# Patient Record
Sex: Female | Born: 1995
Health system: Southern US, Community
[De-identification: ages and names within clinical notes are randomized; demographics above are authoritative.]

## PROBLEM LIST (undated history)

## (undated) DIAGNOSIS — R112 Nausea with vomiting, unspecified: Secondary | ICD-10-CM

## (undated) DIAGNOSIS — Z9889 Other specified postprocedural states: Secondary | ICD-10-CM

## (undated) DIAGNOSIS — D649 Anemia, unspecified: Secondary | ICD-10-CM

## (undated) HISTORY — PX: WISDOM TOOTH EXTRACTION: SHX21

## (undated) HISTORY — PX: BREAST BIOPSY: SHX20

---

## 2013-10-08 ENCOUNTER — Emergency Department: Payer: Self-pay | Admitting: Emergency Medicine

## 2013-10-08 LAB — COMPREHENSIVE METABOLIC PANEL
ALK PHOS: 35 U/L — AB
Albumin: 3.7 g/dL (ref 3.4–5.0)
Anion Gap: 6 — ABNORMAL LOW (ref 7–16)
BUN: 11 mg/dL (ref 7–18)
Bilirubin,Total: 0.4 mg/dL (ref 0.2–1.0)
CALCIUM: 8.9 mg/dL (ref 8.5–10.1)
CREATININE: 0.92 mg/dL (ref 0.60–1.30)
Chloride: 106 mmol/L (ref 98–107)
Co2: 28 mmol/L (ref 21–32)
EGFR (African American): >60
EGFR (Non-African Amer.): >60
GLUCOSE: 90 mg/dL (ref 65–99)
OSMOLALITY: 278 (ref 275–301)
POTASSIUM: 3.7 mmol/L (ref 3.5–5.1)
SGOT(AST): 30 U/L (ref 15–37)
SGPT (ALT): 26 U/L (ref 12–78)
SODIUM: 140 mmol/L (ref 136–145)
TOTAL PROTEIN: 7.2 g/dL (ref 6.4–8.2)

## 2013-10-08 LAB — URINALYSIS, COMPLETE
BILIRUBIN, UR: NEGATIVE
BLOOD: NEGATIVE
GLUCOSE, UR: NEGATIVE mg/dL (ref 0–75)
Leukocyte Esterase: NEGATIVE
Nitrite: NEGATIVE
PH: 5 (ref 4.5–8.0)
Protein: NEGATIVE
RBC,UR: 2 /HPF (ref 0–5)
Specific Gravity: 1.026 (ref 1.003–1.030)
WBC UR: 6 /HPF (ref 0–5)

## 2013-10-08 LAB — CBC WITH DIFFERENTIAL/PLATELET
Basophil #: 0 10*3/uL (ref 0.0–0.1)
Basophil %: 0.4 %
EOS ABS: 0.1 10*3/uL (ref 0.0–0.7)
EOS PCT: 1.4 %
HCT: 36.9 % (ref 35.0–47.0)
HGB: 11.6 g/dL — AB (ref 12.0–16.0)
LYMPHS PCT: 42.2 %
Lymphocyte #: 2.9 10*3/uL (ref 1.0–3.6)
MCH: 25.2 pg — AB (ref 26.0–34.0)
MCHC: 31.4 g/dL — ABNORMAL LOW (ref 32.0–36.0)
MCV: 80 fL (ref 80–100)
MONO ABS: 0.7 x10 3/mm (ref 0.2–0.9)
MONOS PCT: 10.9 %
NEUTROS ABS: 3.1 10*3/uL (ref 1.4–6.5)
NEUTROS PCT: 45.1 %
Platelet: 277 10*3/uL (ref 150–440)
RBC: 4.59 10*6/uL (ref 3.80–5.20)
RDW: 18.7 % — AB (ref 11.5–14.5)
WBC: 6.9 10*3/uL (ref 3.6–11.0)

## 2013-10-08 LAB — LIPASE, BLOOD: Lipase: 240 U/L (ref 73–393)

## 2013-10-09 LAB — URINE CULTURE

## 2015-03-24 DIAGNOSIS — D239 Other benign neoplasm of skin, unspecified: Secondary | ICD-10-CM

## 2015-03-24 HISTORY — DX: Other benign neoplasm of skin, unspecified: D23.9

## 2016-06-05 ENCOUNTER — Encounter: Payer: Self-pay | Admitting: Podiatry

## 2016-06-05 ENCOUNTER — Ambulatory Visit (INDEPENDENT_AMBULATORY_CARE_PROVIDER_SITE_OTHER): Payer: BLUE CROSS/BLUE SHIELD

## 2016-06-05 ENCOUNTER — Ambulatory Visit (INDEPENDENT_AMBULATORY_CARE_PROVIDER_SITE_OTHER): Payer: BLUE CROSS/BLUE SHIELD | Admitting: Podiatry

## 2016-06-05 VITALS — BP 88/61 | HR 66 | Resp 16

## 2016-06-05 DIAGNOSIS — M2042 Other hammer toe(s) (acquired), left foot: Secondary | ICD-10-CM | POA: Diagnosis not present

## 2016-06-05 DIAGNOSIS — M2041 Other hammer toe(s) (acquired), right foot: Secondary | ICD-10-CM

## 2016-06-05 DIAGNOSIS — M722 Plantar fascial fibromatosis: Secondary | ICD-10-CM

## 2016-06-05 NOTE — Progress Notes (Signed)
   Subjective:    Patient ID: Kim Shannon, female    DOB: 09/16/1995, 21 y.o.   MRN: [id number]  HPI: She presents today after having not seen her for many years with a chief complaint of pain and discoloration to the second digits left foot greater than that of the right. States that she walks several miles a day and notices discoloration and pain with temperature changes. She does elect to have a orthotics.  Review of Systems  All other systems reviewed and are negative.      Objective:   Physical Exam: Vital signs are stable alert and oriented 3. Pulses are palpable. Neurologic sensorium is intact. Tendon reflexes are intact muscle strength is normal bilateral. Orthopedic evaluation of his strength all joints distal to the angle full range of motion without crepitation. She has flexible hammertoe deformity second bilateral with a slightly elongated second toe she also has a distal clavus of these toes which is associated with a hammertoe deformity. Once we maximally extend the proximal interphalangeal joint is noticeable that it barely gets to 180. At this point I feel that she started to accommodate some flexure contracture at the level of the PIPJ. She has had a history of fasciitis and apophysitis before.        Assessment & Plan:  History of plantar fasciitis and apophysitis now hammertoe deformity with distal clavi and Raynaud's disease  Plan: Educated her on vasospastic disorder Raynaud's disease and that she potentially has a hammertoe deformity that many surgical intervention at some point in the future. She understands this is amenable to it she was scanned for a new set of orthotics today.Marland Kitchen

## 2016-06-14 DIAGNOSIS — R5383 Other fatigue: Secondary | ICD-10-CM | POA: Diagnosis not present

## 2016-06-14 DIAGNOSIS — R635 Abnormal weight gain: Secondary | ICD-10-CM | POA: Diagnosis not present

## 2016-07-05 ENCOUNTER — Ambulatory Visit: Payer: BLUE CROSS/BLUE SHIELD | Admitting: Podiatry

## 2016-07-26 ENCOUNTER — Encounter: Payer: Self-pay | Admitting: *Deleted

## 2016-08-02 ENCOUNTER — Encounter: Payer: Self-pay | Admitting: Podiatry

## 2016-08-02 ENCOUNTER — Ambulatory Visit (INDEPENDENT_AMBULATORY_CARE_PROVIDER_SITE_OTHER): Payer: BLUE CROSS/BLUE SHIELD | Admitting: Podiatry

## 2016-08-02 DIAGNOSIS — M722 Plantar fascial fibromatosis: Secondary | ICD-10-CM

## 2016-08-02 NOTE — Progress Notes (Signed)
She presents today for follow-up of orthotics as they're doing his greater had no problems whatsoever.  Objective: No pain on palpation of the bilateral heels are mid arches.  Assessment: Will resolve plantar fasciitis.  Plan: Follow up with me as needed.

## 2016-11-15 DIAGNOSIS — L811 Chloasma: Secondary | ICD-10-CM | POA: Diagnosis not present

## 2016-11-15 DIAGNOSIS — L812 Freckles: Secondary | ICD-10-CM | POA: Diagnosis not present

## 2016-11-15 DIAGNOSIS — D485 Neoplasm of uncertain behavior of skin: Secondary | ICD-10-CM | POA: Diagnosis not present

## 2016-11-15 DIAGNOSIS — Z1283 Encounter for screening for malignant neoplasm of skin: Secondary | ICD-10-CM | POA: Diagnosis not present

## 2016-11-30 DIAGNOSIS — Z0001 Encounter for general adult medical examination with abnormal findings: Secondary | ICD-10-CM | POA: Diagnosis not present

## 2016-11-30 DIAGNOSIS — Z6825 Body mass index (BMI) 25.0-25.9, adult: Secondary | ICD-10-CM | POA: Diagnosis not present

## 2016-11-30 DIAGNOSIS — Z30018 Encounter for initial prescription of other contraceptives: Secondary | ICD-10-CM | POA: Diagnosis not present

## 2017-01-31 DIAGNOSIS — Z23 Encounter for immunization: Secondary | ICD-10-CM | POA: Diagnosis not present

## 2017-04-08 DIAGNOSIS — Z30011 Encounter for initial prescription of contraceptive pills: Secondary | ICD-10-CM | POA: Diagnosis not present

## 2017-04-19 ENCOUNTER — Other Ambulatory Visit: Payer: Self-pay | Admitting: Nurse Practitioner

## 2017-04-19 DIAGNOSIS — Z3041 Encounter for surveillance of contraceptive pills: Secondary | ICD-10-CM

## 2017-04-19 MED ORDER — TRI-PREVIFEM 0.18/0.215/0.25 MG-35 MCG PO TABS: 1.0000 | ORAL_TABLET | Freq: Every day | ORAL | 11 refills | Status: DC

## 2017-04-19 NOTE — Progress Notes (Signed)
Renewed patient's birth control pills for next year.

## 2017-04-20 ENCOUNTER — Other Ambulatory Visit: Payer: Self-pay

## 2017-07-16 DIAGNOSIS — Z1283 Encounter for screening for malignant neoplasm of skin: Secondary | ICD-10-CM | POA: Diagnosis not present

## 2017-07-16 DIAGNOSIS — L813 Cafe au lait spots: Secondary | ICD-10-CM | POA: Diagnosis not present

## 2017-07-16 DIAGNOSIS — D485 Neoplasm of uncertain behavior of skin: Secondary | ICD-10-CM | POA: Diagnosis not present

## 2017-07-16 DIAGNOSIS — D229 Melanocytic nevi, unspecified: Secondary | ICD-10-CM | POA: Diagnosis not present

## 2017-10-16 ENCOUNTER — Ambulatory Visit: Payer: BLUE CROSS/BLUE SHIELD | Admitting: Nurse Practitioner

## 2017-10-16 ENCOUNTER — Encounter (INDEPENDENT_AMBULATORY_CARE_PROVIDER_SITE_OTHER): Payer: Self-pay

## 2017-10-16 ENCOUNTER — Encounter: Payer: Self-pay | Admitting: Nurse Practitioner

## 2017-10-16 VITALS — BP 108/72 | HR 73 | Resp 16 | Ht 66.0 in | Wt 163.4 lb

## 2017-10-16 DIAGNOSIS — Z30011 Encounter for initial prescription of contraceptive pills: Secondary | ICD-10-CM

## 2017-10-16 DIAGNOSIS — N926 Irregular menstruation, unspecified: Secondary | ICD-10-CM | POA: Insufficient documentation

## 2017-10-16 DIAGNOSIS — Z0001 Encounter for general adult medical examination with abnormal findings: Secondary | ICD-10-CM | POA: Insufficient documentation

## 2017-10-16 MED ORDER — DROSPIRENONE-ETHINYL ESTRADIOL 3-0.02 MG PO TABS
1.0000 | ORAL_TABLET | Freq: Every day | ORAL | 11 refills | Status: DC
Start: 2017-10-16 — End: 2018-09-18

## 2017-10-16 NOTE — Progress Notes (Signed)
[facility] [place], [place] [address]  Internal MEDICINE  Office Visit Note  Patient Name: Kim Shannon  [id number]  [id number]  Date of Service: 10/16/2017   Pt is here for routine follow up.   Chief Complaint  Patient presents with  . Medication Management    pt here to talk about birth control tri-previfem    The patient states that she has been having spotting in between periods for the past 4 months or longer. She also states that she had her first migraine during her menstrual cycle this past month. She would like to see if there is a different oral contraceptive she could try.       Current Medication: Outpatient Encounter Medications as of 10/16/2017  Medication Sig  . TRI-PREVIFEM 0.18/0.215/0.25 MG-35 MCG tablet Take 1 tablet by mouth daily.  . drospirenone-ethinyl estradiol (YAZ,GIANVI,LORYNA) 3-0.02 MG tablet Take 1 tablet by mouth daily.   No facility-administered encounter medications on file as of 10/16/2017.     Surgical History: History reviewed. No pertinent surgical history.  Medical History: History reviewed. No pertinent past medical history.  Family History: Family History  Problem Relation Age of Onset  . Breast cancer Paternal Grandmother   . Prostate cancer Paternal Grandfather   . Diabetes Paternal Grandfather     Social History   Socioeconomic History  . Marital status: Single    Spouse name: Not on file  . Number of children: Not on file  . Years of education: Not on file  . Highest education level: Not on file  Occupational History  . Not on file  Social Needs  . Financial resource strain: Not on file  . Food insecurity:    Worry: Not on file    Inability: Not on file  . Transportation needs:    Medical: Not on file    Non-medical: Not on file  Tobacco Use  . Smoking status: Never Smoker  . Smokeless tobacco: Never Used  Substance and Sexual Activity  . Alcohol use: No  . Drug use: Not on file   . Sexual activity: Not on file  Lifestyle  . Physical activity:    Days per week: Not on file    Minutes per session: Not on file  . Stress: Not on file  Relationships  . Social connections:    Talks on phone: Not on file    Gets together: Not on file    Attends religious service: Not on file    Active member of club or organization: Not on file    Attends meetings of clubs or organizations: Not on file    Relationship status: Not on file  . Intimate partner violence:    Fear of current or ex partner: Not on file    Emotionally abused: Not on file    Physically abused: Not on file    Forced sexual activity: Not on file  Other Topics Concern  . Not on file  Social History Narrative  . Not on file      Review of Systems  Constitutional: Negative for chills, fatigue and unexpected weight change.  HENT: Negative for congestion, postnasal drip, rhinorrhea, sneezing and sore throat.   Eyes: Negative.  Negative for redness.  Respiratory: Negative for cough, chest tightness and shortness of breath.   Cardiovascular: Negative for chest pain and palpitations.  Gastrointestinal: Negative for abdominal pain, constipation, diarrhea, nausea and vomiting.  Genitourinary: Negative for dysuria and frequency.  Spotting in between periods.   Musculoskeletal: Negative for arthralgias, back pain, joint swelling and neck pain.  Skin: Negative for rash.  Neurological: Positive for headaches. Negative for tremors and numbness.  Hematological: Negative for adenopathy. Does not bruise/bleed easily.  Psychiatric/Behavioral: Negative for behavioral problems (Depression), sleep disturbance and suicidal ideas. The patient is not nervous/anxious.     Vital Signs: BP 108/72 (BP Location: Right Arm, Patient Position: Sitting, Cuff Size: Normal)   Pulse 73   Resp 16   Ht 5\' 6"  (1.676 m)   Wt 163 lb 6.4 oz (74.1 kg)   SpO2 99%   BMI 26.37 kg/m    Physical Exam  Constitutional: She is  oriented to person, place, and time. She appears well-developed and well-nourished. No distress.  HENT:  Head: Normocephalic and atraumatic.  Mouth/Throat: Oropharynx is clear and moist. No oropharyngeal exudate.  Eyes: Pupils are equal, round, and reactive to light. EOM are normal.  Neck: Normal range of motion. Neck supple. No JVD present. No tracheal deviation present. No thyromegaly present.  Cardiovascular: Normal rate, regular rhythm and normal heart sounds. Exam reveals no gallop and no friction rub.  No murmur heard. Pulmonary/Chest: Effort normal. No respiratory distress. She has no wheezes. She has no rales. She exhibits no tenderness.  Abdominal: Soft. Bowel sounds are normal.  Musculoskeletal: Normal range of motion.  Lymphadenopathy:    She has no cervical adenopathy.  Neurological: She is alert and oriented to person, place, and time. No cranial nerve deficit.  Skin: Skin is warm and dry. She is not diaphoretic.  Psychiatric: She has a normal mood and affect. Her behavior is normal. Judgment and thought content normal.  Nursing note and vitals reviewed.  Assessment/Plan: 1. Irregular menstrual bleeding Change oral contraceptive medication to YAZ. Take as directed. Reassess at next visit.   2. Encounter for prescription of oral contraceptives - drospirenone-ethinyl estradiol (YAZ,GIANVI,LORYNA) 3-0.02 MG tablet; Take 1 tablet by mouth daily.  Dispense: 1 Package; Refill: 11  General Counseling: Kim Shannon verbalizes understanding of the findings of todays visit and agrees with plan of treatment. I have discussed any further diagnostic evaluation that may be needed or ordered today. We also reviewed her medications today. she has been encouraged to call the office with any questions or concerns that should arise related to todays visit.    Counseling:   This patient was seen by Leretha Pol, FNP- C in Collaboration with Dr Lavera Guise as a part of collaborative care  agreement  Meds ordered this encounter  Medications  . drospirenone-ethinyl estradiol (YAZ,GIANVI,LORYNA) 3-0.02 MG tablet    Sig: Take 1 tablet by mouth daily.    Dispense:  1 Package    Refill:  11    Order Specific Question:   Supervising Provider    Answer:   Lavera Guise [[id number]]    Time spent: 32 Minutes     Dr Lavera Guise Internal medicine

## 2018-01-21 ENCOUNTER — Encounter: Payer: Self-pay | Admitting: Nurse Practitioner

## 2018-01-21 ENCOUNTER — Ambulatory Visit (INDEPENDENT_AMBULATORY_CARE_PROVIDER_SITE_OTHER): Payer: BLUE CROSS/BLUE SHIELD | Admitting: Nurse Practitioner

## 2018-01-21 VITALS — BP 111/73 | HR 65 | Resp 16 | Ht 66.0 in | Wt 160.0 lb

## 2018-01-21 DIAGNOSIS — Z23 Encounter for immunization: Secondary | ICD-10-CM

## 2018-01-21 DIAGNOSIS — R300 Dysuria: Secondary | ICD-10-CM

## 2018-01-21 DIAGNOSIS — Z0001 Encounter for general adult medical examination with abnormal findings: Secondary | ICD-10-CM

## 2018-01-21 DIAGNOSIS — Z124 Encounter for screening for malignant neoplasm of cervix: Secondary | ICD-10-CM | POA: Diagnosis not present

## 2018-01-21 DIAGNOSIS — E559 Vitamin D deficiency, unspecified: Secondary | ICD-10-CM

## 2018-01-21 NOTE — Progress Notes (Signed)
[facility] [place], [place] [address]  Internal MEDICINE  Office Visit Note  Patient Name: Kim Shannon  [id number]  [id number]  Date of Service: 01/21/2018   Pt is here for routine health maintenance examination  Chief Complaint  Patient presents with  . Gynecologic Exam  . Annual Exam     The patient is here for routine health maintenance exam. She reports doing well. She is currently taking Yax for birth control. Doing well. Did have longer than usual period this past month, but states that she was under more stress than normal. She is due to have a pap smear and routine labs.     Current Medication: Outpatient Encounter Medications as of 01/21/2018  Medication Sig  . drospirenone-ethinyl estradiol (YAZ,GIANVI,LORYNA) 3-0.02 MG tablet Take 1 tablet by mouth daily.  . TRI-PREVIFEM 0.18/0.215/0.25 MG-35 MCG tablet Take 1 tablet by mouth daily.   No facility-administered encounter medications on file as of 01/21/2018.     Surgical History: History reviewed. No pertinent surgical history.  Medical History: History reviewed. No pertinent past medical history.  Family History: Family History  Problem Relation Age of Onset  . Breast cancer Paternal Grandmother   . Prostate cancer Paternal Grandfather   . Diabetes Paternal Grandfather       Review of Systems  Constitutional: Negative for activity change, chills, fatigue and unexpected weight change.  HENT: Negative for congestion, postnasal drip, rhinorrhea, sneezing and sore throat.   Eyes: Negative.  Negative for redness.  Respiratory: Negative for cough, chest tightness, shortness of breath and wheezing.   Cardiovascular: Negative for chest pain and palpitations.  Gastrointestinal: Negative for abdominal pain, constipation, diarrhea, nausea and vomiting.  Endocrine: Negative for cold intolerance, heat intolerance, polydipsia, polyphagia and polyuria.  Genitourinary: Negative for  dyspareunia, dysuria, frequency, vaginal bleeding, vaginal discharge and vaginal pain.  Musculoskeletal: Negative for arthralgias, back pain, joint swelling and neck pain.  Skin: Negative for rash.  Allergic/Immunologic: Negative for environmental allergies.  Neurological: Negative for dizziness, tremors, numbness and headaches.  Hematological: Negative for adenopathy. Does not bruise/bleed easily.  Psychiatric/Behavioral: Negative for behavioral problems (Depression), sleep disturbance and suicidal ideas. The patient is not nervous/anxious.      Vital Signs: BP 111/73 (BP Location: Right Arm, Patient Position: Sitting, Cuff Size: Normal)   Pulse 65   Resp 16   Ht 5\' 6"  (1.676 m)   Wt 160 lb (72.6 kg)   LMP 01/07/2018   SpO2 100%   BMI 25.82 kg/m    Physical Exam  Constitutional: She is oriented to person, place, and time. She appears well-developed and well-nourished. No distress.  HENT:  Head: Normocephalic and atraumatic.  Nose: Nose normal.  Mouth/Throat: Oropharynx is clear and moist. No oropharyngeal exudate.  Eyes: Pupils are equal, round, and reactive to light. Conjunctivae and EOM are normal.  Neck: Normal range of motion. Neck supple. No JVD present. No tracheal deviation present. No thyromegaly present.  Cardiovascular: Normal rate, regular rhythm, normal heart sounds and intact distal pulses. Exam reveals no gallop and no friction rub.  No murmur heard. Pulmonary/Chest: Effort normal and breath sounds normal. No respiratory distress. She has no wheezes. She has no rales. She exhibits no tenderness. Right breast exhibits no inverted nipple, no mass, no nipple discharge, no skin change and no tenderness. Left breast exhibits no inverted nipple, no mass, no nipple discharge, no skin change and no tenderness.  Abdominal: Soft. Bowel sounds are normal. There is no tenderness.  Genitourinary: Vagina normal and uterus normal.  Genitourinary Comments: No tenderness, masses, or  organomeglay present during bimanual exam .  Musculoskeletal: Normal range of motion.  Lymphadenopathy:    She has no cervical adenopathy.  Neurological: She is alert and oriented to person, place, and time. No cranial nerve deficit.  Skin: Skin is warm and dry. Capillary refill takes less than 2 seconds. She is not diaphoretic.  Psychiatric: She has a normal mood and affect. Her behavior is normal. Judgment and thought content normal.  Nursing note and vitals reviewed.  Assessment/Plan: 1. Encounter for general adult medical examination with abnormal findings Annual health maintenance exam today.  - CBC with Differential/Platelet - Comprehensive metabolic panel - T4, free - TSH - Lipid panel  2. Vitamin D deficiency - Vitamin D 1,25 dihydroxy  3. Screening for malignant neoplasm of cervix - Pap IG and HPV (high risk) DNA detection  4. Needs flu shot - Flu Vaccine MDCK QUAD PF  5. Dysuria - UA/M w/rflx Culture, Routine  General Counseling: Annelisa verbalizes understanding of the findings of todays visit and agrees with plan of treatment. I have discussed any further diagnostic evaluation that may be needed or ordered today. We also reviewed her medications today. she has been encouraged to call the office with any questions or concerns that should arise related to todays visit.    Counseling:  This patient was seen by Leretha Pol FNP Collaboration with Dr Lavera Guise as a part of collaborative care agreement  Orders Placed This Encounter  Procedures  . Flu Vaccine MDCK QUAD PF  . UA/M w/rflx Culture, Routine  . CBC with Differential/Platelet  . Comprehensive metabolic panel  . T4, free  . TSH  . Lipid panel  . Vitamin D 1,25 dihydroxy     Time spent: [place], MD  Internal Medicine

## 2018-01-22 LAB — UA/M W/RFLX CULTURE, ROUTINE
Bilirubin, UA: NEGATIVE
Glucose, UA: NEGATIVE
KETONES UA: NEGATIVE
LEUKOCYTES UA: NEGATIVE
Nitrite, UA: NEGATIVE
PROTEIN UA: NEGATIVE
RBC UA: NEGATIVE
SPEC GRAV UA: 1.007 (ref 1.005–1.030)
Urobilinogen, Ur: 0.2 mg/dL (ref 0.2–1.0)
pH, UA: 8.5 — ABNORMAL HIGH (ref 5.0–7.5)

## 2018-01-22 LAB — MICROSCOPIC EXAMINATION: Casts: NONE SEEN /lpf

## 2018-01-24 ENCOUNTER — Telehealth: Payer: Self-pay

## 2018-01-24 LAB — PAP IG AND HPV HIGH-RISK
HPV, high-risk: NEGATIVE
PAP Smear Comment: 0

## 2018-01-24 NOTE — Telephone Encounter (Signed)
-----   Message from Ronnell Freshwater, NP sent at 01/24/2018  3:12 PM EDT ----- Please let the patient know that her pap smear was negative

## 2018-01-24 NOTE — Telephone Encounter (Signed)
Informed pt of her test results

## 2018-01-25 ENCOUNTER — Other Ambulatory Visit: Payer: Self-pay | Admitting: Nurse Practitioner

## 2018-01-25 DIAGNOSIS — E559 Vitamin D deficiency, unspecified: Secondary | ICD-10-CM | POA: Diagnosis not present

## 2018-01-25 DIAGNOSIS — Z0001 Encounter for general adult medical examination with abnormal findings: Secondary | ICD-10-CM | POA: Diagnosis not present

## 2018-01-26 LAB — COMPREHENSIVE METABOLIC PANEL
ALT: 15 IU/L (ref 0–32)
AST: 25 IU/L (ref 0–40)
Albumin/Globulin Ratio: 1.9 (ref 1.2–2.2)
Albumin: 4.6 g/dL (ref 3.5–5.5)
Alkaline Phosphatase: 46 IU/L (ref 39–117)
BUN/Creatinine Ratio: 11 (ref 9–23)
BUN: 9 mg/dL (ref 6–20)
Bilirubin Total: 0.3 mg/dL (ref 0.0–1.2)
CALCIUM: 9.5 mg/dL (ref 8.7–10.2)
CO2: 21 mmol/L (ref 20–29)
CREATININE: 0.83 mg/dL (ref 0.57–1.00)
Chloride: 101 mmol/L (ref 96–106)
GFR calc Af Amer: 114 mL/min/{1.73_m2} (ref >59)
GFR, EST NON AFRICAN AMERICAN: 99 mL/min/{1.73_m2} (ref >59)
GLOBULIN, TOTAL: 2.4 g/dL (ref 1.5–4.5)
GLUCOSE: 83 mg/dL (ref 65–99)
Potassium: 4.1 mmol/L (ref 3.5–5.2)
SODIUM: 139 mmol/L (ref 134–144)
Total Protein: 7 g/dL (ref 6.0–8.5)

## 2018-01-26 LAB — CBC
HEMATOCRIT: 36.7 % (ref 34.0–46.6)
HEMOGLOBIN: 12 g/dL (ref 11.1–15.9)
MCH: 25.4 pg — AB (ref 26.6–33.0)
MCHC: 32.7 g/dL (ref 31.5–35.7)
MCV: 78 fL — AB (ref 79–97)
PLATELETS: 177 10*3/uL (ref 150–450)
RBC: 4.72 x10E6/uL (ref 3.77–5.28)
RDW: 14.8 % (ref 12.3–15.4)
WBC: 8.2 10*3/uL (ref 3.4–10.8)

## 2018-01-26 LAB — LIPID PANEL W/O CHOL/HDL RATIO
Cholesterol, Total: 202 mg/dL — ABNORMAL HIGH (ref 100–199)
HDL: 73 mg/dL (ref >39)
LDL CALC: 113 mg/dL — AB (ref 0–99)
Triglycerides: 79 mg/dL (ref 0–149)
VLDL CHOLESTEROL CAL: 16 mg/dL (ref 5–40)

## 2018-01-26 LAB — T4, FREE: Free T4: 1.46 ng/dL (ref 0.82–1.77)

## 2018-01-26 LAB — T3: T3, Total: 172 ng/dL (ref 71–180)

## 2018-01-26 LAB — VITAMIN D 25 HYDROXY (VIT D DEFICIENCY, FRACTURES): VIT D 25 HYDROXY: 40.2 ng/mL (ref 30.0–100.0)

## 2018-01-26 LAB — TSH: TSH: 2.51 u[IU]/mL (ref 0.450–4.500)

## 2018-01-28 ENCOUNTER — Telehealth: Payer: Self-pay

## 2018-01-28 NOTE — Telephone Encounter (Signed)
-----   Message from Ronnell Freshwater, NP sent at 01/26/2018 10:11 PM EDT ----- Please let the patient know that her labs are back. Her cholesterol is a tiny bit high, but all other labs were good. Thanks. Also, we can mail her out prudent diet information.

## 2018-01-28 NOTE — Telephone Encounter (Signed)
Left message on pt voicemail informing her of her results also stated we could mail  prudent diet information to her and informed her that if she had any questions or concerns to give [place] a call at the office.

## 2018-02-01 ENCOUNTER — Telehealth: Payer: Self-pay

## 2018-02-01 NOTE — Telephone Encounter (Signed)
Mailed the prudent diet to patient

## 2018-02-01 NOTE — Telephone Encounter (Signed)
-----   Message from Ronnell Freshwater, NP sent at 01/26/2018 10:11 PM EDT ----- Please let the patient know that her labs are back. Her cholesterol is a tiny bit high, but all other labs were good. Thanks. Also, we can mail her out prudent diet information.

## 2018-08-22 DIAGNOSIS — D225 Melanocytic nevi of trunk: Secondary | ICD-10-CM | POA: Diagnosis not present

## 2018-08-22 DIAGNOSIS — L578 Other skin changes due to chronic exposure to nonionizing radiation: Secondary | ICD-10-CM | POA: Diagnosis not present

## 2018-08-22 DIAGNOSIS — Z1283 Encounter for screening for malignant neoplasm of skin: Secondary | ICD-10-CM | POA: Diagnosis not present

## 2018-08-22 DIAGNOSIS — D485 Neoplasm of uncertain behavior of skin: Secondary | ICD-10-CM | POA: Diagnosis not present

## 2018-08-27 DIAGNOSIS — M62830 Muscle spasm of back: Secondary | ICD-10-CM | POA: Diagnosis not present

## 2018-08-27 DIAGNOSIS — M9904 Segmental and somatic dysfunction of sacral region: Secondary | ICD-10-CM | POA: Diagnosis not present

## 2018-08-27 DIAGNOSIS — M5127 Other intervertebral disc displacement, lumbosacral region: Secondary | ICD-10-CM | POA: Diagnosis not present

## 2018-08-27 DIAGNOSIS — M9903 Segmental and somatic dysfunction of lumbar region: Secondary | ICD-10-CM | POA: Diagnosis not present

## 2018-08-29 DIAGNOSIS — M62830 Muscle spasm of back: Secondary | ICD-10-CM | POA: Diagnosis not present

## 2018-08-29 DIAGNOSIS — M9903 Segmental and somatic dysfunction of lumbar region: Secondary | ICD-10-CM | POA: Diagnosis not present

## 2018-08-29 DIAGNOSIS — M5127 Other intervertebral disc displacement, lumbosacral region: Secondary | ICD-10-CM | POA: Diagnosis not present

## 2018-08-29 DIAGNOSIS — M9904 Segmental and somatic dysfunction of sacral region: Secondary | ICD-10-CM | POA: Diagnosis not present

## 2018-09-03 DIAGNOSIS — M62830 Muscle spasm of back: Secondary | ICD-10-CM | POA: Diagnosis not present

## 2018-09-03 DIAGNOSIS — M9903 Segmental and somatic dysfunction of lumbar region: Secondary | ICD-10-CM | POA: Diagnosis not present

## 2018-09-03 DIAGNOSIS — M9904 Segmental and somatic dysfunction of sacral region: Secondary | ICD-10-CM | POA: Diagnosis not present

## 2018-09-03 DIAGNOSIS — M5127 Other intervertebral disc displacement, lumbosacral region: Secondary | ICD-10-CM | POA: Diagnosis not present

## 2018-09-05 DIAGNOSIS — M5127 Other intervertebral disc displacement, lumbosacral region: Secondary | ICD-10-CM | POA: Diagnosis not present

## 2018-09-05 DIAGNOSIS — M62830 Muscle spasm of back: Secondary | ICD-10-CM | POA: Diagnosis not present

## 2018-09-05 DIAGNOSIS — M9903 Segmental and somatic dysfunction of lumbar region: Secondary | ICD-10-CM | POA: Diagnosis not present

## 2018-09-05 DIAGNOSIS — M9904 Segmental and somatic dysfunction of sacral region: Secondary | ICD-10-CM | POA: Diagnosis not present

## 2018-09-09 DIAGNOSIS — M62830 Muscle spasm of back: Secondary | ICD-10-CM | POA: Diagnosis not present

## 2018-09-09 DIAGNOSIS — M9903 Segmental and somatic dysfunction of lumbar region: Secondary | ICD-10-CM | POA: Diagnosis not present

## 2018-09-09 DIAGNOSIS — M5127 Other intervertebral disc displacement, lumbosacral region: Secondary | ICD-10-CM | POA: Diagnosis not present

## 2018-09-09 DIAGNOSIS — M9904 Segmental and somatic dysfunction of sacral region: Secondary | ICD-10-CM | POA: Diagnosis not present

## 2018-09-12 DIAGNOSIS — M5127 Other intervertebral disc displacement, lumbosacral region: Secondary | ICD-10-CM | POA: Diagnosis not present

## 2018-09-12 DIAGNOSIS — M62830 Muscle spasm of back: Secondary | ICD-10-CM | POA: Diagnosis not present

## 2018-09-12 DIAGNOSIS — M9904 Segmental and somatic dysfunction of sacral region: Secondary | ICD-10-CM | POA: Diagnosis not present

## 2018-09-12 DIAGNOSIS — M9903 Segmental and somatic dysfunction of lumbar region: Secondary | ICD-10-CM | POA: Diagnosis not present

## 2018-09-18 ENCOUNTER — Other Ambulatory Visit: Payer: Self-pay

## 2018-09-18 DIAGNOSIS — Z30011 Encounter for initial prescription of contraceptive pills: Secondary | ICD-10-CM

## 2018-09-18 MED ORDER — DROSPIRENONE-ETHINYL ESTRADIOL 3-0.02 MG PO TABS: 1.0000 | ORAL_TABLET | Freq: Every day | ORAL | 1 refills | Status: DC

## 2018-10-23 ENCOUNTER — Telehealth: Payer: Self-pay

## 2018-10-23 NOTE — Telephone Encounter (Signed)
PER PATIENTS INSURANCE THERE IS A GAP IN CARE. THIS IS NOT A PATIENT OF [facility].

## 2019-01-24 ENCOUNTER — Encounter: Payer: Self-pay | Admitting: Nurse Practitioner

## 2019-01-24 ENCOUNTER — Ambulatory Visit (INDEPENDENT_AMBULATORY_CARE_PROVIDER_SITE_OTHER): Payer: BLUE CROSS/BLUE SHIELD | Admitting: Nurse Practitioner

## 2019-01-24 ENCOUNTER — Other Ambulatory Visit: Payer: Self-pay

## 2019-01-24 VITALS — BP 117/78 | HR 66 | Temp 96.7°F | Resp 16 | Ht 66.0 in | Wt 163.0 lb

## 2019-01-24 DIAGNOSIS — R300 Dysuria: Secondary | ICD-10-CM

## 2019-01-24 DIAGNOSIS — Z30011 Encounter for initial prescription of contraceptive pills: Secondary | ICD-10-CM

## 2019-01-24 DIAGNOSIS — Z0001 Encounter for general adult medical examination with abnormal findings: Secondary | ICD-10-CM

## 2019-01-24 DIAGNOSIS — N6322 Unspecified lump in the left breast, upper inner quadrant: Secondary | ICD-10-CM | POA: Diagnosis not present

## 2019-01-24 MED ORDER — DROSPIRENONE-ETHINYL ESTRADIOL 3-0.02 MG PO TABS: 1.0000 | ORAL_TABLET | Freq: Every day | ORAL | 3 refills | Status: DC

## 2019-01-24 NOTE — Progress Notes (Signed)
[facility] [place], [place] [address]  Internal MEDICINE  Office Visit Note  Patient Name: Kim Shannon  [id number]  [id number]  Date of Service: 01/24/2019  Chief Complaint  Patient presents with  . Annual Exam     The patient is here for health maintenance exam. She is doing well without concerns or complaints. Last pap was done 01/2018 and was normal. She is due to have routine, fasting labs. She does state that she had some breast tenderness in the lower mid portion of the left breast. This started right after she had menstrual cycle. Lasted for two weeks then resolved on it's own.   Pt is here for routine health maintenance examination  Current Medication: Outpatient Encounter Medications as of 01/24/2019  Medication Sig  . drospirenone-ethinyl estradiol (YAZ) 3-0.02 MG tablet Take 1 tablet by mouth daily.  . [DISCONTINUED] drospirenone-ethinyl estradiol (YAZ) 3-0.02 MG tablet Take 1 tablet by mouth daily.  . [DISCONTINUED] TRI-PREVIFEM 0.18/0.215/0.25 MG-35 MCG tablet Take 1 tablet by mouth daily. (Patient not taking: Reported on 09/18/2018)   No facility-administered encounter medications on file as of 01/24/2019.     Surgical History: History reviewed. No pertinent surgical history.  Medical History: History reviewed. No pertinent past medical history.  Family History: Family History  Problem Relation Age of Onset  . Breast cancer Paternal Grandmother   . Prostate cancer Paternal Grandfather   . Diabetes Paternal Grandfather       Review of Systems  Constitutional: Negative for activity change, chills, fatigue and unexpected weight change.  HENT: Negative for congestion, postnasal drip, rhinorrhea, sneezing and sore throat.   Respiratory: Negative for cough, chest tightness, shortness of breath and wheezing.   Cardiovascular: Negative for chest pain and palpitations.  Gastrointestinal: Negative for abdominal pain,  constipation, diarrhea, nausea and vomiting.  Endocrine: Negative for cold intolerance, heat intolerance, polydipsia and polyuria.  Genitourinary:       Has had breast tenderness in lower inner quadrant of left breast. Lasted for about 2 weeks and then resolved on it's own.   Musculoskeletal: Negative for arthralgias, back pain, joint swelling and neck pain.  Skin: Negative for rash.  Neurological: Negative for dizziness, tremors, numbness and headaches.  Hematological: Negative for adenopathy. Does not bruise/bleed easily.  Psychiatric/Behavioral: Negative for behavioral problems (Depression), sleep disturbance and suicidal ideas. The patient is not nervous/anxious.     Today's Vitals   01/24/19 0949  BP: 117/78  Pulse: 66  Resp: 16  Temp: (!) 96.7 F (35.9 C)  SpO2: 100%  Weight: 163 lb (73.9 kg)  Height: 5\' 6"  (1.676 m)   Body mass index is 26.31 kg/m.  Physical Exam Vitals signs and nursing note reviewed.  Constitutional:      General: She is not in acute distress.    Appearance: Normal appearance. She is well-developed. She is not diaphoretic.  HENT:     Head: Normocephalic and atraumatic.     Nose: Nose normal.     Mouth/Throat:     Pharynx: No oropharyngeal exudate.  Eyes:     Pupils: Pupils are equal, round, and reactive to light.  Neck:     Musculoskeletal: Normal range of motion and neck supple. No muscular tenderness.     Thyroid: No thyromegaly.     Vascular: No JVD.     Trachea: No tracheal deviation.  Cardiovascular:     Rate and Rhythm: Normal rate and regular rhythm.     Pulses: Normal pulses.  Heart sounds: Normal heart sounds. No murmur. No friction rub. No gallop.   Pulmonary:     Effort: Pulmonary effort is normal. No respiratory distress.     Breath sounds: No wheezing or rales.  Chest:     Chest wall: No tenderness.     Breasts:        Right: Normal. No swelling, bleeding, inverted nipple, mass, nipple discharge, skin change or tenderness.         Left: Mass present. No swelling, bleeding, inverted nipple, nipple discharge, skin change or tenderness.    Abdominal:     General: Abdomen is flat. Bowel sounds are normal.     Palpations: Abdomen is soft.     Tenderness: There is no abdominal tenderness.  Musculoskeletal: Normal range of motion.  Lymphadenopathy:     Cervical: No cervical adenopathy.     Upper Body:     Right upper body: No supraclavicular or axillary adenopathy.     Left upper body: No supraclavicular or axillary adenopathy.  Skin:    General: Skin is warm and dry.  Neurological:     Mental Status: She is alert and oriented to person, place, and time.     Cranial Nerves: No cranial nerve deficit.  Psychiatric:        Behavior: Behavior normal.        Thought Content: Thought content normal.        Judgment: Judgment normal.   Assessment/Plan: 1. Encounter for general adult medical examination with abnormal findings Annual wellness visit today. Lab slip given to check routine, fasting labs.   2. Mass of upper inner quadrant of left breast Will get ultrasound of area of concern for further evaluation.  - US BREAST LTD UNI LEFT INC AXILLA; Future  3. Encounter for prescription of oral contraceptives - drospirenone-ethinyl estradiol (YAZ) 3-0.02 MG tablet; Take 1 tablet by mouth daily.  Dispense: 90 tablet; Refill: 3  4. Dysuria - UA/M w/rflx Culture, Routine  General Counseling: Kim Shannon verbalizes understanding of the findings of todays visit and agrees with plan of treatment. I have discussed any further diagnostic evaluation that may be needed or ordered today. We also reviewed her medications today. she has been encouraged to call the office with any questions or concerns that should arise related to todays visit.    Counseling:  This patient was seen by Leretha Pol FNP Collaboration with Dr Lavera Guise as a part of collaborative care agreement  Orders Placed This Encounter  Procedures  .  US BREAST LTD UNI LEFT INC AXILLA  . UA/M w/rflx Culture, Routine    Meds ordered this encounter  Medications  . drospirenone-ethinyl estradiol (YAZ) 3-0.02 MG tablet    Sig: Take 1 tablet by mouth daily.    Dispense:  90 tablet    Refill:  3    Order Specific Question:   Supervising Provider    Answer:   Lavera Guise [id number]    Time spent: [place], MD  Internal Medicine

## 2019-01-25 LAB — UA/M W/RFLX CULTURE, ROUTINE
Bilirubin, UA: NEGATIVE
Glucose, UA: NEGATIVE
Ketones, UA: NEGATIVE
Leukocytes,UA: NEGATIVE
Nitrite, UA: NEGATIVE
Protein,UA: NEGATIVE
RBC, UA: NEGATIVE
Specific Gravity, UA: 1.016 (ref 1.005–1.030)
Urobilinogen, Ur: 0.2 mg/dL (ref 0.2–1.0)
pH, UA: 7 (ref 5.0–7.5)

## 2019-01-25 LAB — MICROSCOPIC EXAMINATION: Casts: NONE SEEN /lpf

## 2019-01-30 ENCOUNTER — Other Ambulatory Visit: Payer: Self-pay | Admitting: Nurse Practitioner

## 2019-01-30 DIAGNOSIS — E782 Mixed hyperlipidemia: Secondary | ICD-10-CM | POA: Diagnosis not present

## 2019-01-30 DIAGNOSIS — Z0001 Encounter for general adult medical examination with abnormal findings: Secondary | ICD-10-CM | POA: Diagnosis not present

## 2019-01-30 DIAGNOSIS — E559 Vitamin D deficiency, unspecified: Secondary | ICD-10-CM | POA: Diagnosis not present

## 2019-01-31 ENCOUNTER — Ambulatory Visit
Admission: RE | Admit: 2019-01-31 | Discharge: 2019-01-31 | Disposition: A | Discharge status: Home / Self Care | Payer: BLUE CROSS/BLUE SHIELD | Source: Ambulatory Visit | Attending: Nurse Practitioner | Admitting: Nurse Practitioner

## 2019-01-31 DIAGNOSIS — N6489 Other specified disorders of breast: Secondary | ICD-10-CM | POA: Diagnosis not present

## 2019-01-31 DIAGNOSIS — N6322 Unspecified lump in the left breast, upper inner quadrant: Secondary | ICD-10-CM | POA: Insufficient documentation

## 2019-01-31 LAB — TSH: TSH: 1.8 u[IU]/mL (ref 0.450–4.500)

## 2019-01-31 LAB — CBC
Hematocrit: 37.6 % (ref 34.0–46.6)
Hemoglobin: 12.2 g/dL (ref 11.1–15.9)
MCH: 26.6 pg (ref 26.6–33.0)
MCHC: 32.4 g/dL (ref 31.5–35.7)
MCV: 82 fL (ref 79–97)
Platelets: 188 10*3/uL (ref 150–450)
RBC: 4.59 x10E6/uL (ref 3.77–5.28)
RDW: 14.2 % (ref 11.7–15.4)
WBC: 7.6 10*3/uL (ref 3.4–10.8)

## 2019-01-31 LAB — COMPREHENSIVE METABOLIC PANEL
ALT: 12 IU/L (ref 0–32)
AST: 25 IU/L (ref 0–40)
Albumin/Globulin Ratio: 1.8 (ref 1.2–2.2)
Albumin: 4.4 g/dL (ref 3.9–5.0)
Alkaline Phosphatase: 42 IU/L (ref 39–117)
BUN/Creatinine Ratio: 9 (ref 9–23)
BUN: 9 mg/dL (ref 6–20)
Bilirubin Total: 0.3 mg/dL (ref 0.0–1.2)
CO2: 21 mmol/L (ref 20–29)
Calcium: 9.5 mg/dL (ref 8.7–10.2)
Chloride: 101 mmol/L (ref 96–106)
Creatinine, Ser: 0.96 mg/dL (ref 0.57–1.00)
GFR calc Af Amer: 95 mL/min/{1.73_m2} (ref >59)
GFR calc non Af Amer: 82 mL/min/{1.73_m2} (ref >59)
Globulin, Total: 2.4 g/dL (ref 1.5–4.5)
Glucose: 85 mg/dL (ref 65–99)
Potassium: 4.3 mmol/L (ref 3.5–5.2)
Sodium: 136 mmol/L (ref 134–144)
Total Protein: 6.8 g/dL (ref 6.0–8.5)

## 2019-01-31 LAB — LIPID PANEL W/O CHOL/HDL RATIO
Cholesterol, Total: 204 mg/dL — ABNORMAL HIGH (ref 100–199)
HDL: 72 mg/dL (ref >39)
LDL Chol Calc (NIH): 118 mg/dL — ABNORMAL HIGH (ref 0–99)
Triglycerides: 80 mg/dL (ref 0–149)
VLDL Cholesterol Cal: 14 mg/dL (ref 5–40)

## 2019-01-31 LAB — VITAMIN D 25 HYDROXY (VIT D DEFICIENCY, FRACTURES): Vit D, 25-Hydroxy: 48.1 ng/mL (ref 30.0–100.0)

## 2019-01-31 LAB — T4, FREE: Free T4: 1.2 ng/dL (ref 0.82–1.77)

## 2019-02-03 ENCOUNTER — Other Ambulatory Visit: Payer: Self-pay | Admitting: Nurse Practitioner

## 2019-02-03 DIAGNOSIS — N6320 Unspecified lump in the left breast, unspecified quadrant: Secondary | ICD-10-CM

## 2019-02-03 DIAGNOSIS — R928 Other abnormal and inconclusive findings on diagnostic imaging of breast: Secondary | ICD-10-CM

## 2019-02-04 NOTE — Progress Notes (Signed)
Six month repeat breast ultrasound recommended. Will discuss with patient at follow up visit 02/06/2019

## 2019-02-04 NOTE — Progress Notes (Signed)
Review with patient during visit 02/06/2019

## 2019-02-06 ENCOUNTER — Encounter: Payer: Self-pay | Admitting: Nurse Practitioner

## 2019-02-06 ENCOUNTER — Ambulatory Visit: Payer: BLUE CROSS/BLUE SHIELD | Admitting: Nurse Practitioner

## 2019-02-06 ENCOUNTER — Other Ambulatory Visit: Payer: Self-pay

## 2019-02-06 VITALS — Temp 98.3°F | Ht 66.0 in | Wt 162.4 lb

## 2019-02-06 DIAGNOSIS — N6322 Unspecified lump in the left breast, upper inner quadrant: Secondary | ICD-10-CM

## 2019-02-06 NOTE — Progress Notes (Signed)
[facility] [place], [place] [address]  Internal MEDICINE  Telephone Visit  Patient Name: Kim Shannon  [id number]  [id number]  Date of Service: 02/22/2019  I connected with the patient at 12:47pm by webcam and verified the patients identity using two identifiers.   I discussed the limitations, risks, security and privacy concerns of performing an evaluation and management service by webcam and the availability of in person appointments. I also discussed with the patient that there may be a patient responsible charge related to the service.  The patient expressed understanding and agrees to proceed.    Chief Complaint  Patient presents with  . Telephone Assessment  . Telephone Screen  . Follow-up    ultrasound follow up    The patient has been contacted via telephone for follow up visit due to concerns for spread of novel coronavirus. The patient recently had ultrasound of the left breast due to small mass found during CPE. Ultrasound showed an oval circumscribed hypoechoic mass with parallel long axis and increased through transmission at the 11 o'clock position of the left breast 3 cm from the nipple. This measures 0.6 x 1.1 x 1.3 cm correlating to patient's palpable abnormality. This likely represents a benign mass such as a fibroadenoma. Though appearing benign, the patient is scheduled to have biopsy of the mass 02/19/2019.        Current Medication: Outpatient Encounter Medications as of 02/06/2019  Medication Sig  . drospirenone-ethinyl estradiol (YAZ) 3-0.02 MG tablet Take 1 tablet by mouth daily.   No facility-administered encounter medications on file as of 02/06/2019.     Surgical History: History reviewed. No pertinent surgical history.  Medical History: History reviewed. No pertinent past medical history.  Family History: Family History  Problem Relation Age of Onset  . Breast cancer Paternal Grandmother   . Prostate cancer  Paternal Grandfather   . Diabetes Paternal Grandfather     Social History   Socioeconomic History  . Marital status: Single    Spouse name: Not on file  . Number of children: Not on file  . Years of education: Not on file  . Highest education level: Not on file  Occupational History  . Not on file  Social Needs  . Financial resource strain: Not on file  . Food insecurity    Worry: Not on file    Inability: Not on file  . Transportation needs    Medical: Not on file    Non-medical: Not on file  Tobacco Use  . Smoking status: Never Smoker  . Smokeless tobacco: Never Used  Substance and Sexual Activity  . Alcohol use: Yes    Alcohol/week: 1.0 standard drinks    Types: 1 Glasses of wine per week    Comment: weekly  . Drug use: Never  . Sexual activity: Not on file  Lifestyle  . Physical activity    Days per week: Not on file    Minutes per session: Not on file  . Stress: Not on file  Relationships  . Social Herbalist on phone: Not on file    Gets together: Not on file    Attends religious service: Not on file    Active member of club or organization: Not on file    Attends meetings of clubs or organizations: Not on file    Relationship status: Not on file  . Intimate partner violence    Fear of current or ex partner: Not on  file    Emotionally abused: Not on file    Physically abused: Not on file    Forced sexual activity: Not on file  Other Topics Concern  . Not on file  Social History Narrative  . Not on file      Review of Systems  Constitutional: Negative for activity change, chills, fatigue and unexpected weight change.  HENT: Negative for congestion, postnasal drip, rhinorrhea, sneezing and sore throat.   Respiratory: Negative for cough, chest tightness and shortness of breath.   Cardiovascular: Negative for chest pain and palpitations.  Gastrointestinal: Negative for abdominal pain, constipation, diarrhea, nausea and vomiting.   Genitourinary: Negative for dysuria and frequency.       Small, palpable mass in the left breast.   Musculoskeletal: Negative for arthralgias, back pain, joint swelling and neck pain.  Skin: Negative for rash.  Neurological: Negative for tremors.  Hematological: Negative for adenopathy. Does not bruise/bleed easily.  Psychiatric/Behavioral: Negative for behavioral problems (Depression), sleep disturbance and suicidal ideas. The patient is not nervous/anxious.     Today's Vitals   02/06/19 1203  Temp: 98.3 F (36.8 C)  Weight: 162 lb 6.4 oz (73.7 kg)  Height: 5\' 6"  (1.676 m)   Body mass index is 26.21 kg/m.  Observation/Objective:   The patient is alert and oriented. She is pleasant and answers all questions appropriately. Breathing is non-labored. She is in no acute distress at this time.    Assessment/Plan: 1. Mass of upper inner quadrant of left breast Ultrasound of the left breast showing an oval circumscribed hypoechoic mass with parallel long axis and increased through transmission at the 11 o'clock position of the left breast 3 cm from the nipple. This measures 0.6 x 1.1 x 1.3 cm correlating to patient's palpable abnormality. This likely represents a benign mass such as a fibroadenoma. She is scheduled to have biopsy of the mass on 02/19/2019.   General Counseling: Ariely verbalizes understanding of the findings of today's phone visit and agrees with plan of treatment. I have discussed any further diagnostic evaluation that may be needed or ordered today. We also reviewed her medications today. she has been encouraged to call the office with any questions or concerns that should arise related to todays visit.   Time spent: 79 Minutes    Dr Lavera Guise Internal medicine

## 2019-02-19 ENCOUNTER — Ambulatory Visit
Admission: RE | Admit: 2019-02-19 | Discharge: 2019-02-19 | Disposition: A | Discharge status: Home / Self Care | Payer: BLUE CROSS/BLUE SHIELD | Source: Ambulatory Visit | Attending: Nurse Practitioner | Admitting: Nurse Practitioner

## 2019-02-19 DIAGNOSIS — N6082 Other benign mammary dysplasias of left breast: Secondary | ICD-10-CM | POA: Diagnosis not present

## 2019-02-19 DIAGNOSIS — R928 Other abnormal and inconclusive findings on diagnostic imaging of breast: Secondary | ICD-10-CM

## 2019-02-19 DIAGNOSIS — N6320 Unspecified lump in the left breast, unspecified quadrant: Secondary | ICD-10-CM | POA: Diagnosis not present

## 2019-02-19 DIAGNOSIS — N6322 Unspecified lump in the left breast, upper inner quadrant: Secondary | ICD-10-CM | POA: Diagnosis not present

## 2019-02-19 NOTE — Progress Notes (Signed)
Biopsy of small mass doen today 02/19/2019

## 2019-02-20 LAB — SURGICAL PATHOLOGY

## 2019-02-24 NOTE — Progress Notes (Signed)
Benign fibroadenoma.

## 2019-03-24 DIAGNOSIS — L700 Acne vulgaris: Secondary | ICD-10-CM | POA: Diagnosis not present

## 2019-03-24 DIAGNOSIS — L810 Postinflammatory hyperpigmentation: Secondary | ICD-10-CM | POA: Diagnosis not present

## 2019-03-24 DIAGNOSIS — L739 Follicular disorder, unspecified: Secondary | ICD-10-CM | POA: Diagnosis not present

## 2019-07-07 ENCOUNTER — Other Ambulatory Visit: Payer: Self-pay

## 2019-07-07 ENCOUNTER — Ambulatory Visit (INDEPENDENT_AMBULATORY_CARE_PROVIDER_SITE_OTHER): Payer: Self-pay

## 2019-07-07 DIAGNOSIS — L680 Hirsutism: Secondary | ICD-10-CM

## 2019-07-07 NOTE — Progress Notes (Signed)
LHR to upper/lower legs. $400

## 2019-08-07 ENCOUNTER — Ambulatory Visit: Payer: BC Managed Care – PPO | Admitting: Nurse Practitioner

## 2019-08-07 ENCOUNTER — Other Ambulatory Visit: Payer: Self-pay

## 2019-08-07 ENCOUNTER — Encounter: Payer: Self-pay | Admitting: Nurse Practitioner

## 2019-08-07 VITALS — BP 114/70 | HR 66 | Temp 97.4°F | Resp 16 | Ht 66.0 in | Wt 166.0 lb

## 2019-08-07 DIAGNOSIS — N6322 Unspecified lump in the left breast, upper inner quadrant: Secondary | ICD-10-CM

## 2019-08-07 NOTE — Progress Notes (Signed)
[facility] [place], [place] [address]  Internal MEDICINE  Office Visit Note  Patient Name: Kim Shannon  [id number]  [id number]  Date of Service: 08/20/2019  Chief Complaint  Patient presents with  . Follow-up    The patient is here for follow up visit. She has lump in the left breast which has been present for some time. This was confirmed per ultrasound. She did have biopsy of of the mass. It was negative.  No further recommendations made unless mass changes. She has no new concerns or complaints at this time.       Current Medication: Outpatient Encounter Medications as of 08/07/2019  Medication Sig  . drospirenone-ethinyl estradiol (YAZ) 3-0.02 MG tablet Take 1 tablet by mouth daily.   No facility-administered encounter medications on file as of 08/07/2019.    Surgical History: History reviewed. No pertinent surgical history.  Medical History: History reviewed. No pertinent past medical history.  Family History: Family History  Problem Relation Age of Onset  . Breast cancer Paternal Grandmother   . Prostate cancer Paternal Grandfather   . Diabetes Paternal Grandfather     Social History   Socioeconomic History  . Marital status: Single    Spouse name: Not on file  . Number of children: Not on file  . Years of education: Not on file  . Highest education level: Not on file  Occupational History  . Not on file  Tobacco Use  . Smoking status: Never Smoker  . Smokeless tobacco: Never Used  Substance and Sexual Activity  . Alcohol use: Yes    Alcohol/week: 1.0 standard drinks    Types: 1 Glasses of wine per week    Comment: weekly  . Drug use: Never  . Sexual activity: Not on file  Other Topics Concern  . Not on file  Social History Narrative  . Not on file   Social Determinants of Health   Financial Resource Strain:   . Difficulty of Paying Living Expenses:   Food Insecurity:   . Worried About Charity fundraiser  in the Last Year:   . Arboriculturist in the Last Year:   Transportation Needs:   . Film/video editor (Medical):   Marland Kitchen Lack of Transportation (Non-Medical):   Physical Activity:   . Days of Exercise per Week:   . Minutes of Exercise per Session:   Stress:   . Feeling of Stress :   Social Connections:   . Frequency of Communication with Friends and Family:   . Frequency of Social Gatherings with Friends and Family:   . Attends Religious Services:   . Active Member of Clubs or Organizations:   . Attends Archivist Meetings:   Marland Kitchen Marital Status:   Intimate Partner Violence:   . Fear of Current or Ex-Partner:   . Emotionally Abused:   Marland Kitchen Physically Abused:   . Sexually Abused:       Review of Systems  Constitutional: Negative for activity change, chills, fatigue and unexpected weight change.  HENT: Negative for congestion, postnasal drip, rhinorrhea, sneezing and sore throat.   Respiratory: Negative for cough, chest tightness and shortness of breath.   Cardiovascular: Negative for chest pain and palpitations.  Gastrointestinal: Negative for abdominal pain, constipation, diarrhea, nausea and vomiting.  Genitourinary: Negative for dysuria and frequency.       Small, palpable mass in the left breast.   Musculoskeletal: Negative for arthralgias, back pain, joint swelling and neck  pain.  Skin: Negative for rash.  Neurological: Negative for tremors.  Hematological: Negative for adenopathy. Does not bruise/bleed easily.  Psychiatric/Behavioral: Negative for behavioral problems (Depression), sleep disturbance and suicidal ideas. The patient is not nervous/anxious.     Today's Vitals   08/07/19 1532  BP: 114/70  Pulse: 66  Resp: 16  Temp: (!) 97.4 F (36.3 C)  SpO2: 100%  Weight: 166 lb (75.3 kg)  Height: 5\' 6"  (1.676 m)   Body mass index is 26.79 kg/m.  Physical Exam Vitals and nursing note reviewed.  Constitutional:      General: She is not in acute  distress.    Appearance: Normal appearance. She is well-developed. She is not diaphoretic.  HENT:     Head: Normocephalic and atraumatic.     Nose: Nose normal.     Mouth/Throat:     Pharynx: No oropharyngeal exudate.  Eyes:     Pupils: Pupils are equal, round, and reactive to light.  Neck:     Thyroid: No thyromegaly.     Vascular: No JVD.     Trachea: No tracheal deviation.  Cardiovascular:     Rate and Rhythm: Normal rate and regular rhythm.     Pulses: Normal pulses.     Heart sounds: Normal heart sounds. No murmur. No friction rub. No gallop.   Pulmonary:     Effort: Pulmonary effort is normal. No respiratory distress.     Breath sounds: No wheezing or rales.  Chest:     Chest wall: No tenderness.     Breasts:        Right: Normal. No swelling, bleeding, inverted nipple, mass, nipple discharge, skin change or tenderness.        Left: Mass present. No swelling, bleeding, inverted nipple, nipple discharge, skin change or tenderness.    Abdominal:     General: Abdomen is flat. Bowel sounds are normal.     Palpations: Abdomen is soft.     Tenderness: There is no abdominal tenderness.  Musculoskeletal:        General: Normal range of motion.     Cervical back: Normal range of motion and neck supple. No muscular tenderness.  Lymphadenopathy:     Cervical: No cervical adenopathy.     Upper Body:     Right upper body: No supraclavicular or axillary adenopathy.     Left upper body: No supraclavicular or axillary adenopathy.  Skin:    General: Skin is warm and dry.  Neurological:     Mental Status: She is alert and oriented to person, place, and time.     Cranial Nerves: No cranial nerve deficit.  Psychiatric:        Behavior: Behavior normal.        Thought Content: Thought content normal.        Judgment: Judgment normal.    Assessment/Plan: 1. Mass of upper inner quadrant of left breast Reviewed results of ultrasound and biopsy of small mass. Benign results  confirmed. Will continue to monitor.    General Counseling: Avon verbalizes understanding of the findings of todays visit and agrees with plan of treatment. I have discussed any further diagnostic evaluation that may be needed or ordered today. We also reviewed her medications today. she has been encouraged to call the office with any questions or concerns that should arise related to todays visit.   This patient was seen by Leretha Pol FNP Collaboration with Dr Lavera Guise as a part of collaborative care agreement  Total time spent: 20 Minutes   Time spent includes review of chart, medications, test results, and follow up plan with the patient.      Dr Lavera Guise Internal medicine

## 2019-09-01 ENCOUNTER — Ambulatory Visit: Payer: BC Managed Care – PPO

## 2019-09-02 ENCOUNTER — Ambulatory Visit: Payer: BC Managed Care – PPO

## 2019-09-22 ENCOUNTER — Ambulatory Visit (INDEPENDENT_AMBULATORY_CARE_PROVIDER_SITE_OTHER): Payer: BC Managed Care – PPO | Admitting: Dermatology

## 2019-09-22 ENCOUNTER — Other Ambulatory Visit: Payer: Self-pay

## 2019-09-22 DIAGNOSIS — D229 Melanocytic nevi, unspecified: Secondary | ICD-10-CM

## 2019-09-22 DIAGNOSIS — D2262 Melanocytic nevi of left upper limb, including shoulder: Secondary | ICD-10-CM

## 2019-09-22 DIAGNOSIS — L811 Chloasma: Secondary | ICD-10-CM

## 2019-09-22 DIAGNOSIS — Z1283 Encounter for screening for malignant neoplasm of skin: Secondary | ICD-10-CM | POA: Diagnosis not present

## 2019-09-22 DIAGNOSIS — L739 Follicular disorder, unspecified: Secondary | ICD-10-CM | POA: Diagnosis not present

## 2019-09-22 DIAGNOSIS — L814 Other melanin hyperpigmentation: Secondary | ICD-10-CM

## 2019-09-22 DIAGNOSIS — Z86018 Personal history of other benign neoplasm: Secondary | ICD-10-CM

## 2019-09-22 DIAGNOSIS — L578 Other skin changes due to chronic exposure to nonionizing radiation: Secondary | ICD-10-CM

## 2019-09-22 DIAGNOSIS — L821 Other seborrheic keratosis: Secondary | ICD-10-CM

## 2019-09-22 DIAGNOSIS — L700 Acne vulgaris: Secondary | ICD-10-CM

## 2019-09-22 DIAGNOSIS — D1801 Hemangioma of skin and subcutaneous tissue: Secondary | ICD-10-CM

## 2019-09-22 DIAGNOSIS — D225 Melanocytic nevi of trunk: Secondary | ICD-10-CM

## 2019-09-22 MED ORDER — DAPSONE 7.5 % EX GEL: CUTANEOUS | 3 refills | Status: DC

## 2019-09-22 MED ORDER — SULFACETAMIDE SODIUM-SULFUR 8-4 % EX SUSP: CUTANEOUS | 6 refills | Status: DC

## 2019-09-22 MED ORDER — TAZORAC 0.05 % EX CREA: TOPICAL_CREAM | Freq: Every day | CUTANEOUS | 3 refills | Status: DC

## 2019-09-22 NOTE — Progress Notes (Signed)
Follow-Up Visit   Subjective  Kim Shannon is a 24 y.o. female who presents for the following: Annual Exam (hx dysplastic nevus and acne/folliculitis f/u - patient currently using Differin 0.3% gel for the acne on her face and Clindamycin lotion for the folliculitis on her legs. Aczone was not covered by insurance per patient) The patient presents for Total-Body Skin Exam (TBSE) for skin cancer screening and mole check.  The following portions of the chart were reviewed this encounter and updated as appropriate:  Tobacco  Allergies  Meds  Problems  Med Hx  Surg Hx  Fam Hx     Review of Systems:  No other skin or systemic complaints except as noted in HPI or Assessment and Plan.  Objective  Well appearing patient in no apparent distress; mood and affect are within normal limits.  A full examination was performed including scalp, head, eyes, ears, nose, lips, neck, chest, axillae, abdomen, back, buttocks, bilateral upper extremities, bilateral lower extremities, hands, feet, fingers, toes, fingernails, and toenails. All findings within normal limits unless otherwise noted below.  Objective  Face and chest: 3 small papules on the face. 10 papules on the chest.  Objective  L side above the waistline: Scar with no evidence of recurrence.   Objective  B/L leg: One crusted papule  Objective  Face: Reticulated hyperpigmented patches.   Objective  L shoulder, LUQA periumbilical: 0.3 cm slightly irregular brown macules.  Images      Assessment & Plan  Acne vulgaris Face and chest  D/C Differin and restart Aczone (will send in generic). Start Sulfacleanse wash daily. Start Tazorac 0.05% cream QHS to the chest and face. Topical retinoid medications like tretinoin/Retin-A, adapalene/Differin, tazarotene/Fabior, and Epiduo/Epiduo Forte can cause dryness and irritation when first started. Only apply a pea-sized amount to the entire affected area. Avoid applying it  around the eyes, edges of mouth and creases at the nose. If you experience irritation, use a good moisturizer first and/or apply the medicine less often. If you are doing well with the medicine, you can increase how often you use it until you are applying every night. Be careful with sun protection while using this medication as it can make you sensitive to the sun. This medicine should not be used by pregnant women.    Sulfacetamide Sodium-Sulfur (SULFACLEANSE 8/4) 8-4 % SUSP - Face and chest  Dapsone (ACZONE) 7.5 % GEL - Face and chest  tazarotene (TAZORAC) 0.05 % cream - Face and chest  History of dysplastic nevus L side above the waistline  Clear. Observe for recurrence. Call clinic for new or changing lesions.  Recommend regular skin exams, daily broad-spectrum spf 30+ sunscreen use, and photoprotection.     Folliculitis B/L leg  Improved - Continue Clindamycin lotion QD  Melasma Face  Benign, observe.    Nevus (2) L shoulder; LUQA periumbilical  Observe  Skin cancer screening   Lentigines - Scattered tan macules - Discussed due to sun exposure - Benign, observe - Call for any changes  Seborrheic Keratoses - Stuck-on, waxy, tan-brown papules and plaques  - Discussed benign etiology and prognosis. - Observe - Call for any changes  Melanocytic Nevi - Tan-brown and/or pink-flesh-colored symmetric macules and papules - Benign appearing on exam today - Observation - Call clinic for new or changing moles - Recommend daily use of broad spectrum spf 30+ sunscreen to sun-exposed areas.   Hemangiomas - Red papules - Discussed benign nature - Observe - Call for any changes  Actinic Damage - diffuse scaly erythematous macules with underlying dyspigmentation - Recommend daily broad spectrum sunscreen SPF 30+ to sun-exposed areas, reapply every 2 hours as needed.  - Call for new or changing lesions.  Skin cancer screening performed today.   Return in about 4  months (around 01/22/2020) for acne follow up .  Luther Redo, CMA, am acting as scribe for Sarina Ser, MD .  Documentation: I have reviewed the above documentation for accuracy and completeness, and I agree with the above.  Sarina Ser, MD

## 2019-09-22 NOTE — Patient Instructions (Signed)

## 2019-09-24 ENCOUNTER — Encounter: Payer: Self-pay | Admitting: Dermatology

## 2019-09-29 ENCOUNTER — Telehealth: Payer: Self-pay

## 2019-09-29 MED ORDER — DAPSONE 5 % EX GEL: 1.0000 "application " | Freq: Every morning | CUTANEOUS | 2 refills | Status: DC

## 2019-09-29 NOTE — Telephone Encounter (Signed)
Sure, switch to Dapsone 5% topical for acne.

## 2019-09-29 NOTE — Telephone Encounter (Signed)
Dapsone 5% sent in to [facility].

## 2019-09-29 NOTE — Telephone Encounter (Signed)
Dapsone 7.5% is not carried by [facility]. They do carry the Dapsone 5%, are you OK to swtich?

## 2019-10-27 ENCOUNTER — Other Ambulatory Visit: Payer: Self-pay

## 2019-10-27 DIAGNOSIS — Z30011 Encounter for initial prescription of contraceptive pills: Secondary | ICD-10-CM

## 2019-10-27 MED ORDER — DROSPIRENONE-ETHINYL ESTRADIOL 3-0.02 MG PO TABS: 1.0000 | ORAL_TABLET | Freq: Every day | ORAL | 3 refills | Status: DC

## 2019-11-03 ENCOUNTER — Ambulatory Visit (INDEPENDENT_AMBULATORY_CARE_PROVIDER_SITE_OTHER): Payer: Self-pay

## 2019-11-03 ENCOUNTER — Other Ambulatory Visit: Payer: Self-pay

## 2019-11-03 DIAGNOSIS — L680 Hirsutism: Secondary | ICD-10-CM

## 2019-12-22 ENCOUNTER — Ambulatory Visit: Payer: BC Managed Care – PPO

## 2020-01-19 ENCOUNTER — Encounter: Payer: BC Managed Care – PPO | Admitting: Dermatology

## 2020-02-06 ENCOUNTER — Ambulatory Visit (INDEPENDENT_AMBULATORY_CARE_PROVIDER_SITE_OTHER): Payer: BC Managed Care – PPO | Admitting: Nurse Practitioner

## 2020-02-06 ENCOUNTER — Encounter: Payer: Self-pay | Admitting: Nurse Practitioner

## 2020-02-06 ENCOUNTER — Other Ambulatory Visit: Payer: Self-pay

## 2020-02-06 VITALS — BP 115/72 | HR 66 | Temp 97.8°F | Resp 16 | Ht 66.0 in | Wt 164.0 lb

## 2020-02-06 DIAGNOSIS — Z0001 Encounter for general adult medical examination with abnormal findings: Secondary | ICD-10-CM

## 2020-02-06 DIAGNOSIS — Z1231 Encounter for screening mammogram for malignant neoplasm of breast: Secondary | ICD-10-CM

## 2020-02-06 DIAGNOSIS — L739 Follicular disorder, unspecified: Secondary | ICD-10-CM

## 2020-02-06 DIAGNOSIS — Z23 Encounter for immunization: Secondary | ICD-10-CM

## 2020-02-06 DIAGNOSIS — T781XXA Other adverse food reactions, not elsewhere classified, initial encounter: Secondary | ICD-10-CM

## 2020-02-06 DIAGNOSIS — N6322 Unspecified lump in the left breast, upper inner quadrant: Secondary | ICD-10-CM | POA: Diagnosis not present

## 2020-02-06 MED ORDER — DOXYCYCLINE HYCLATE 100 MG PO TABS: 100.0000 mg | ORAL_TABLET | Freq: Every day | ORAL | 2 refills | Status: DC

## 2020-02-06 NOTE — Progress Notes (Signed)
[facility] [place],  [address]  Internal MEDICINE  Office Visit Note  Patient Name: Kim Shannon  [id number]  [id number]  Date of Service: 02/28/2020  Chief Complaint  Patient presents with  . Annual Exam  . Quality Metric Gaps    flu,tetnaus,Hep C  . controlled substance form    reviewed with PT     The patient is here for health maintenance exam. She has been experiencing rash, comprised of small pusules on her upper thighs and lower legs. Has had similar problem in the past and diagnosed with folliculitis. Round of oral antibiotics and topical corticosteroids has helped. She has also had cyst in the left breast last year. Biopsy had negative results. Unsure of maternal family history as her mother was adopted. We should get baseline screening mammogram. She should have rouitne, fasting labs. She would liek to get her flu vaccine while she is in the office today.   Pt is here for routine health maintenance examination  Current Medication: Outpatient Encounter Medications as of 02/06/2020  Medication Sig  . Dapsone (ACZONE) 5 % topical gel Apply 1 application topically in the morning.  . Dapsone (ACZONE) 7.5 % GEL Apply a thin coat to the face daily  . drospirenone-ethinyl estradiol (YAZ) 3-0.02 MG tablet Take 1 tablet by mouth daily.  Marland Kitchen Sulfacetamide Sodium-Sulfur (SULFACLEANSE 8/4) 8-4 % SUSP Wash the face and chest daily with medication  . tazarotene (TAZORAC) 0.05 % cream Apply topically at bedtime. Apply a thin coat to the entire face and chest QHS  . doxycycline (VIBRA-TABS) 100 MG tablet Take 1 tablet (100 mg total) by mouth daily.   No facility-administered encounter medications on file as of 02/06/2020.    Surgical History: History reviewed. No pertinent surgical history.  Medical History: Past Medical History:  Diagnosis Date  . Dysplastic nevus 03/24/2015   left side above waistline, mild atypia, lateral margin  involved.    Family History: Family History  Problem Relation Age of Onset  . Breast cancer Paternal Grandmother   . Prostate cancer Paternal Grandfather   . Diabetes Paternal Grandfather       Review of Systems  Constitutional: Negative for activity change, chills, fatigue and unexpected weight change.  HENT: Negative for congestion, postnasal drip, rhinorrhea, sneezing and sore throat.   Respiratory: Negative for cough, chest tightness and shortness of breath.   Cardiovascular: Negative for chest pain and palpitations.  Gastrointestinal: Negative for abdominal pain, constipation, diarrhea, nausea and vomiting.  Endocrine: Negative for cold intolerance, heat intolerance, polydipsia and polyuria.  Genitourinary: Negative for dysuria, frequency, hematuria, menstrual problem and urgency.  Musculoskeletal: Negative for arthralgias, back pain, joint swelling and neck pain.  Skin: Positive for rash.       Pustular rash present on thighs and lower legs.   Allergic/Immunologic: Negative for environmental allergies.  Neurological: Negative for dizziness, tremors, numbness and headaches.  Hematological: Negative for adenopathy. Does not bruise/bleed easily.  Psychiatric/Behavioral: Negative for behavioral problems (Depression), sleep disturbance and suicidal ideas. The patient is not nervous/anxious.      Today's Vitals   02/06/20 0937  BP: 115/72  Pulse: 66  Resp: 16  Temp: 97.8 F (36.6 C)  SpO2: 99%  Weight: 164 lb (74.4 kg)  Height: _0  (1.676 m)   Body mass index is 26.47 kg/m.  Physical Exam Vitals and nursing note reviewed.  Constitutional:      General: She is not in acute distress.    Appearance:  Normal appearance. She is well-developed. She is not diaphoretic.  HENT:     Head: Normocephalic and atraumatic.     Nose: Nose normal.     Mouth/Throat:     Pharynx: No oropharyngeal exudate.  Eyes:     Pupils: Pupils are equal, round, and reactive to light.  Neck:      Thyroid: No thyromegaly.     Vascular: No JVD.     Trachea: No tracheal deviation.  Cardiovascular:     Rate and Rhythm: Normal rate and regular rhythm.     Pulses: Normal pulses.     Heart sounds: Normal heart sounds. No murmur heard.  No friction rub. No gallop.   Pulmonary:     Effort: Pulmonary effort is normal. No respiratory distress.     Breath sounds: Normal breath sounds. No wheezing or rales.  Chest:     Chest wall: No tenderness.     Breasts:        Right: Normal. No swelling, bleeding, inverted nipple, mass, nipple discharge, skin change or tenderness.        Left: Mass present. No swelling, bleeding, inverted nipple, nipple discharge, skin change or tenderness.    Abdominal:     General: Bowel sounds are normal.     Palpations: Abdomen is soft.     Tenderness: There is no abdominal tenderness.  Musculoskeletal:        General: Normal range of motion.     Cervical back: Normal range of motion and neck supple.  Lymphadenopathy:     Cervical: No cervical adenopathy.     Upper Body:     Right upper body: No axillary adenopathy.     Left upper body: No axillary adenopathy.  Skin:    General: Skin is warm and dry.     Capillary Refill: Capillary refill takes less than 2 seconds.     Findings: Rash present.     Comments: Rash on upper thighs and lower legs comprised of small pustular lesions. Itchy and slightly erythematous.   Neurological:     General: No focal deficit present.     Mental Status: She is alert and oriented to person, place, and time.     Cranial Nerves: No cranial nerve deficit.  Psychiatric:        Mood and Affect: Mood normal.        Behavior: Behavior normal.        Thought Content: Thought content normal.        Judgment: Judgment normal.      LABS: Recent Results (from the past 2160 hour(s))  CBC     Status: Abnormal   Collection Time: 02/11/20  7:41 AM  Result Value Ref Range   WBC 6.7 3.4 - 10.8 x10E3/uL   RBC 4.80 3.77 - 5.28  x10E6/uL   Hemoglobin 12.3 11.1 - 15.9 g/dL   Hematocrit 38.0 34.0 - 46.6 %   MCV 79 79 - 97 fL   MCH 25.6 (L) 26.6 - 33.0 pg   MCHC 32.4 31 - 35 g/dL   RDW 16.1 (H) 11.7 - 15.4 %   Platelets 168 150 - 450 F68L2/XN  Basic metabolic panel     Status: None   Collection Time: 02/11/20  7:41 AM  Result Value Ref Range   Glucose 87 65 - 99 mg/dL   BUN 8 6 - 20 mg/dL   Creatinine, Ser 0.90 0.57 - 1.00 mg/dL   GFR calc non Af Amer 89 >59 mL/min/1.73  GFR calc Af Amer 102 >59 mL/min/1.73    Comment: **In accordance with recommendations from the NKF-ASN Task force,**   [facility] is in the process of updating its eGFR calculation to the   2021 CKD-EPI creatinine equation that estimates kidney function   without a race variable.    BUN/Creatinine Ratio 9 9 - 23   Sodium 135 134 - 144 mmol/L   Potassium 4.5 3.5 - 5.2 mmol/L   Chloride 100 96 - 106 mmol/L   CO2 22 20 - 29 mmol/L   Calcium 9.4 8.7 - 10.2 mg/dL  Lipid Panel With LDL/HDL Ratio     Status: Abnormal   Collection Time: 02/11/20  7:41 AM  Result Value Ref Range   Cholesterol, Total 206 (H) 100 - 199 mg/dL   Triglycerides 115 0 - 149 mg/dL   HDL 80 >39 mg/dL   VLDL Cholesterol Cal 20 5 - 40 mg/dL   LDL Chol Calc (NIH) 106 (H) 0 - 99 mg/dL   LDL/HDL Ratio 1.3 0.0 - 3.2 ratio    Comment:                                     LDL/HDL Ratio                                             Men  Women                               1/2 Avg.Risk  1.0    1.5                                   Avg.Risk  3.6    3.2                                2X Avg.Risk  6.2    5.0                                3X Avg.Risk  8.0    6.1   Iron and TIBC     Status: Abnormal   Collection Time: 02/11/20  7:41 AM  Result Value Ref Range   Total Iron Binding Capacity 498 (H) 250 - 450 ug/dL   UIBC 334 131 - 425 ug/dL   Iron 164 (H) 27 - 159 ug/dL   Iron Saturation 33 15 - 55 %  B12 and Folate Panel     Status: None   Collection Time: 02/11/20  7:41 AM    Result Value Ref Range   Vitamin B-12 340 232 - 1,245 pg/mL   Folate >20.0 >3.0 ng/mL    Comment: A serum folate concentration of less than 3.1 ng/mL is considered to represent clinical deficiency.   T4, free     Status: None   Collection Time: 02/11/20  7:41 AM  Result Value Ref Range   Free T4 1.30 0.82 - 1.77 ng/dL  TSH     Status: None   Collection Time: 02/11/20  7:41 AM  Result Value Ref Range  TSH 2.200 0.450 - 4.500 uIU/mL  VITAMIN D 25 Hydroxy (Vit-D Deficiency, Fractures)     Status: None   Collection Time: 02/11/20  7:41 AM  Result Value Ref Range   Vit D, 25-Hydroxy 47.0 30.0 - 100.0 ng/mL    Comment: Vitamin D deficiency has been defined by the [place] practice guideline as a level of serum 25-OH vitamin D less than 20 ng/mL (1,2). The Endocrine Society went on to further define vitamin D insufficiency as a level between 21 and 29 ng/mL (2). 1. IOM (Institute of Medicine). 2010. Dietary reference    intakes for calcium and D. [place]: The    [employer]. 2. Holick MF, Binkley [place], Bischoff-Ferrari HA, et al.    Evaluation, treatment, and prevention of vitamin D    deficiency: an Endocrine Society clinical practice    guideline. JCEM. 2011 Jul; 96(7):1911-30.   Gluten Sensitivity Screen     Status: None   Collection Time: 02/11/20  7:41 AM  Result Value Ref Range   tTG/DGP Screen Negative Negative  Allergens(7)     Status: None   Collection Time: 02/11/20  7:41 AM  Result Value Ref Range   Class Description Allergens Comment     Comment:     Levels of Specific IgE       Class  Description of Class     ---------------------------  -----  --------------------                    < 0.10         0         Negative            0.10 -    0.31         0/I       Equivocal/Low            0.32 -    0.55         I         Low            0.56 -    1.40         II        Moderate            1.41 -    3.90          III       High            3.91 -   19.00         IV        Very High           19.01 -  100.00         V         Very High                   >100.00         VI        Very High    Peanut IgE CANCELED kU/L    Comment: Duplicate procedure ordered.  Result canceled by the ancillary.    Hazelnut ([place]) IgE <0.10 Class 0 kU/L   [place] Nut IgE <0.10 Class 0 kU/L   F020-IgE Almond <0.10 Class 0 kU/L   Pecan Nut IgE <0.10 Class 0 kU/L   F202-IgE Cashew Nut <0.10 Class 0 kU/L   Walnut IgE <0.10 Class 0 kU/L  IgE Food Basic w/Component Rfx     Status: None   Collection Time: 02/11/20  7:41 AM  Result Value Ref Range   F001-IgE Egg White <0.10 Class 0 kU/L   F002-IgE Milk <0.10 Class 0 kU/L   Codfish IgE <0.10 Class 0 kU/L   Wheat IgE CANCELED kU/L    Comment: Test not performed. Duplicate tests ordered.  Result canceled by the ancillary.    Peanut, IgE <0.10 Class 0 kU/L   Soybean IgE <0.10 Class 0 kU/L  Ferritin     Status: None   Collection Time: 02/11/20  7:41 AM  Result Value Ref Range   Ferritin 19 15.0 - 150.0 ng/mL  Antigliadin IgG (native)     Status: None   Collection Time: 02/11/20  7:41 AM  Result Value Ref Range   Antigliadin IgG (native) 14 0 - 19 units    Comment:                    Negative                   0 - 19                    Weak Positive             20 - 30                    Moderate to Strong Positive   >30   F004-IgE Wheat     Status: None   Collection Time: 02/11/20  7:41 AM  Result Value Ref Range   Wheat IgE <0.10 Class 0 kU/L  Note:     Status: None   Collection Time: 02/11/20  7:41 AM  Result Value Ref Range   Note: Comment     Comment: Not suggestive of gluten sensitivity.    Assessment/Plan: 1. Encounter for general adult medical examination with abnormal findings Annual health maintenance exam today.  Order slip given to have routine, fasting labs drawn.  2. Folliculitis Upper thighs and lower legs. Treat with doxycycline 137m  daily along with topical corticosteroid use as needed and as indicated.  - doxycycline (VIBRA-TABS) 100 MG tablet; Take 1 tablet (100 mg total) by mouth daily.  Dispense: 30 tablet; Refill: 2  3. Allergic reaction to food, initial encounter Add basic food allergy profile to routine, fasting labs   4. Mass of upper inner quadrant of left breast Will get screening mammogram for baseline. - MM DIGITAL SCREENING BILATERAL; Future  5. Encounter for screening mammogram for malignant neoplasm of breast - MM DIGITAL SCREENING BILATERAL; Future  6. Flu vaccine need Flu vaccine administered today - Flu Vaccine MDCK QUAD PF  General Counseling: Amare verbalizes understanding of the findings of todays visit and agrees with plan of treatment. I have discussed any further diagnostic evaluation that may be needed or ordered today. We also reviewed her medications today. she has been encouraged to call the office with any questions or concerns that should arise related to todays visit.    Counseling:  This patient was seen by HLeretha PolFNP Collaboration with Dr FLavera Guiseas a part of collaborative care agreement  Orders Placed This Encounter  Procedures  . MM DIGITAL SCREENING BILATERAL  . Flu Vaccine MDCK QUAD PF    Meds ordered this encounter  Medications  . doxycycline (VIBRA-TABS) 100 MG tablet    Sig: Take 1 tablet (100 mg total)  by mouth daily.    Dispense:  30 tablet    Refill:  2    Order Specific Question:   Supervising Provider    Answer:   Lavera Guise [[id number]]    Total time spent: 55 Minutes  Time spent includes review of chart, medications, test results, and follow up plan with the patient.     Lavera Guise, MD  Internal Medicine

## 2020-02-06 NOTE — Progress Notes (Deleted)
[facility] [place], [place] [address]  Internal MEDICINE  Office Visit Note  Patient Name: Kim Shannon  [id number]  [id number]  Date of Service: 02/06/2020  Chief Complaint  Patient presents with  . Annual Exam  . Quality Metric Gaps    flu,tetnaus,Hep C  . controlled substance form    reviewed with PT    HPI     Current Medication: Outpatient Encounter Medications as of 02/06/2020  Medication Sig  . Dapsone (ACZONE) 5 % topical gel Apply 1 application topically in the morning.  . Dapsone (ACZONE) 7.5 % GEL Apply a thin coat to the face daily  . drospirenone-ethinyl estradiol (YAZ) 3-0.02 MG tablet Take 1 tablet by mouth daily.  Marland Kitchen Sulfacetamide Sodium-Sulfur (SULFACLEANSE 8/4) 8-4 % SUSP Wash the face and chest daily with medication  . tazarotene (TAZORAC) 0.05 % cream Apply topically at bedtime. Apply a thin coat to the entire face and chest QHS   No facility-administered encounter medications on file as of 02/06/2020.    Surgical History: History reviewed. No pertinent surgical history.  Medical History: Past Medical History:  Diagnosis Date  . Dysplastic nevus 03/24/2015   left side above waistline, mild atypia, lateral margin involved.    Family History: Family History  Problem Relation Age of Onset  . Breast cancer Paternal Grandmother   . Prostate cancer Paternal Grandfather   . Diabetes Paternal Grandfather     Social History   Socioeconomic History  . Marital status: Single    Spouse name: Not on file  . Number of children: Not on file  . Years of education: Not on file  . Highest education level: Not on file  Occupational History  . Not on file  Tobacco Use  . Smoking status: Never Smoker  . Smokeless tobacco: Never Used  Substance and Sexual Activity  . Alcohol use: Yes    Alcohol/week: 1.0 standard drink    Types: 1 Glasses of wine per week    Comment: weekly  . Drug use: Never  . Sexual activity:  Not on file  Other Topics Concern  . Not on file  Social History Narrative  . Not on file   Social Determinants of Health   Financial Resource Strain:   . Difficulty of Paying Living Expenses: Not on file  Food Insecurity:   . Worried About Charity fundraiser in the Last Year: Not on file  . Ran Out of Food in the Last Year: Not on file  Transportation Needs:   . Lack of Transportation (Medical): Not on file  . Lack of Transportation (Non-Medical): Not on file  Physical Activity:   . Days of Exercise per Week: Not on file  . Minutes of Exercise per Session: Not on file  Stress:   . Feeling of Stress : Not on file  Social Connections:   . Frequency of Communication with Friends and Family: Not on file  . Frequency of Social Gatherings with Friends and Family: Not on file  . Attends Religious Services: Not on file  . Active Member of Clubs or Organizations: Not on file  . Attends Archivist Meetings: Not on file  . Marital Status: Not on file  Intimate Partner Violence:   . Fear of Current or Ex-Partner: Not on file  . Emotionally Abused: Not on file  . Physically Abused: Not on file  . Sexually Abused: Not on file      Review of Systems  Vital Signs: BP 115/72   Pulse 66   Temp 97.8 F (36.6 C)   Resp 16   Ht 5\' 6"  (1.676 m)   Wt 164 lb (74.4 kg)   SpO2 99%   BMI 26.47 kg/m    Physical Exam     Assessment/Plan:   General Counseling: [name] verbalizes understanding of the findings of todays visit and agrees with plan of treatment. I have discussed any further diagnostic evaluation that may be needed or ordered today. We also reviewed her medications today. she has been encouraged to call the office with any questions or concerns that should arise related to todays visit.    No orders of the defined types were placed in this encounter.   No orders of the defined types were placed in this encounter.   Total time spent:*** Minutes Time  spent includes review of chart, medications, test results, and follow up plan with the patient.      Dr Lavera Guise Internal medicine

## 2020-02-11 ENCOUNTER — Other Ambulatory Visit: Payer: Self-pay | Admitting: Nurse Practitioner

## 2020-02-11 DIAGNOSIS — E559 Vitamin D deficiency, unspecified: Secondary | ICD-10-CM | POA: Diagnosis not present

## 2020-02-11 DIAGNOSIS — D509 Iron deficiency anemia, unspecified: Secondary | ICD-10-CM | POA: Diagnosis not present

## 2020-02-11 DIAGNOSIS — Z0001 Encounter for general adult medical examination with abnormal findings: Secondary | ICD-10-CM | POA: Diagnosis not present

## 2020-02-11 DIAGNOSIS — T781XXA Other adverse food reactions, not elsewhere classified, initial encounter: Secondary | ICD-10-CM | POA: Diagnosis not present

## 2020-02-14 LAB — IGE FOOD BASIC W/COMPONENT RFX
Codfish IgE: <0.1 kU/L
F001-IgE Egg White: <0.1 kU/L
F002-IgE Milk: <0.1 kU/L
Peanut, IgE: <0.1 kU/L
Soybean IgE: <0.1 kU/L

## 2020-02-14 LAB — TSH: TSH: 2.2 u[IU]/mL (ref 0.450–4.500)

## 2020-02-14 LAB — BASIC METABOLIC PANEL
BUN/Creatinine Ratio: 9 (ref 9–23)
BUN: 8 mg/dL (ref 6–20)
CO2: 22 mmol/L (ref 20–29)
Calcium: 9.4 mg/dL (ref 8.7–10.2)
Chloride: 100 mmol/L (ref 96–106)
Creatinine, Ser: 0.9 mg/dL (ref 0.57–1.00)
GFR calc Af Amer: 102 mL/min/{1.73_m2} (ref >59)
GFR calc non Af Amer: 89 mL/min/{1.73_m2} (ref >59)
Glucose: 87 mg/dL (ref 65–99)
Potassium: 4.5 mmol/L (ref 3.5–5.2)
Sodium: 135 mmol/L (ref 134–144)

## 2020-02-14 LAB — B12 AND FOLATE PANEL
Folate: >20 ng/mL (ref >3.0)
Vitamin B-12: 340 pg/mL (ref 232–1245)

## 2020-02-14 LAB — LIPID PANEL WITH LDL/HDL RATIO
Cholesterol, Total: 206 mg/dL — ABNORMAL HIGH (ref 100–199)
HDL: 80 mg/dL (ref >39)
LDL Chol Calc (NIH): 106 mg/dL — ABNORMAL HIGH (ref 0–99)
LDL/HDL Ratio: 1.3 ratio (ref 0.0–3.2)
Triglycerides: 115 mg/dL (ref 0–149)
VLDL Cholesterol Cal: 20 mg/dL (ref 5–40)

## 2020-02-14 LAB — ALLERGENS(7)
Brazil Nut IgE: <0.1 kU/L
F020-IgE Almond: <0.1 kU/L
F202-IgE Cashew Nut: <0.1 kU/L
Hazelnut (Filbert) IgE: <0.1 kU/L
Pecan Nut IgE: <0.1 kU/L
Walnut IgE: <0.1 kU/L

## 2020-02-14 LAB — CBC
Hematocrit: 38 % (ref 34.0–46.6)
Hemoglobin: 12.3 g/dL (ref 11.1–15.9)
MCH: 25.6 pg — ABNORMAL LOW (ref 26.6–33.0)
MCHC: 32.4 g/dL (ref 31.5–35.7)
MCV: 79 fL (ref 79–97)
Platelets: 168 10*3/uL (ref 150–450)
RBC: 4.8 x10E6/uL (ref 3.77–5.28)
RDW: 16.1 % — ABNORMAL HIGH (ref 11.7–15.4)
WBC: 6.7 10*3/uL (ref 3.4–10.8)

## 2020-02-14 LAB — IRON AND TIBC
Iron Saturation: 33 % (ref 15–55)
Iron: 164 ug/dL — ABNORMAL HIGH (ref 27–159)
Total Iron Binding Capacity: 498 ug/dL — ABNORMAL HIGH (ref 250–450)
UIBC: 334 ug/dL (ref 131–425)

## 2020-02-14 LAB — F004-IGE WHEAT: Wheat IgE: <0.1 kU/L

## 2020-02-14 LAB — NOTE:

## 2020-02-14 LAB — VITAMIN D 25 HYDROXY (VIT D DEFICIENCY, FRACTURES): Vit D, 25-Hydroxy: 47 ng/mL (ref 30.0–100.0)

## 2020-02-14 LAB — ANTIGLIADIN IGG (NATIVE): Antigliadin IgG (native): 14 units (ref 0–19)

## 2020-02-14 LAB — GLUTEN SENSITIVITY SCREEN: tTG/DGP Screen: NEGATIVE

## 2020-02-14 LAB — FERRITIN: Ferritin: 19 ng/mL (ref 15–150)

## 2020-02-14 LAB — T4, FREE: Free T4: 1.3 ng/dL (ref 0.82–1.77)

## 2020-02-18 ENCOUNTER — Telehealth: Payer: Self-pay

## 2020-02-18 NOTE — Progress Notes (Signed)
Please let the patient know that all food allergens we checked were negative. Her bad and total cholesterol were a little elevated, however, better than prior checks. Other labs were good. Thanks.

## 2020-02-18 NOTE — Telephone Encounter (Signed)
Pt informed of lab results and allergen results.

## 2020-02-28 DIAGNOSIS — L739 Follicular disorder, unspecified: Secondary | ICD-10-CM | POA: Insufficient documentation

## 2020-02-28 DIAGNOSIS — T781XXA Other adverse food reactions, not elsewhere classified, initial encounter: Secondary | ICD-10-CM | POA: Insufficient documentation

## 2020-02-28 DIAGNOSIS — Z1231 Encounter for screening mammogram for malignant neoplasm of breast: Secondary | ICD-10-CM | POA: Insufficient documentation

## 2020-03-01 ENCOUNTER — Ambulatory Visit (INDEPENDENT_AMBULATORY_CARE_PROVIDER_SITE_OTHER): Payer: Self-pay

## 2020-03-01 ENCOUNTER — Other Ambulatory Visit: Payer: Self-pay

## 2020-03-01 DIAGNOSIS — L680 Hirsutism: Secondary | ICD-10-CM

## 2020-04-21 DIAGNOSIS — Z20828 Contact with and (suspected) exposure to other viral communicable diseases: Secondary | ICD-10-CM | POA: Diagnosis not present

## 2020-06-16 ENCOUNTER — Ambulatory Visit (INDEPENDENT_AMBULATORY_CARE_PROVIDER_SITE_OTHER): Payer: BC Managed Care – PPO | Admitting: Dermatology

## 2020-06-16 ENCOUNTER — Other Ambulatory Visit: Payer: Self-pay

## 2020-06-16 DIAGNOSIS — L739 Follicular disorder, unspecified: Secondary | ICD-10-CM

## 2020-06-16 DIAGNOSIS — D229 Melanocytic nevi, unspecified: Secondary | ICD-10-CM | POA: Diagnosis not present

## 2020-06-16 DIAGNOSIS — L700 Acne vulgaris: Secondary | ICD-10-CM

## 2020-06-16 MED ORDER — AKLIEF 0.005 % EX CREA: TOPICAL_CREAM | CUTANEOUS | 3 refills | Status: DC

## 2020-06-16 MED ORDER — CLINDAMYCIN PHOSPHATE 1 % EX LOTN: TOPICAL_LOTION | Freq: Two times a day (BID) | CUTANEOUS | 0 refills | Status: DC

## 2020-06-16 MED ORDER — CLINDAMYCIN PHOS-BENZOYL PEROX 1.2-2.5 % EX GEL: CUTANEOUS | 3 refills | Status: DC

## 2020-06-16 NOTE — Progress Notes (Signed)
   Follow-Up Visit   Subjective  Kim Shannon is a 25 y.o. female who presents for the following: Acne (Acne flaring on face. Has been using Dapsone gel and SulfaCleanse. Not currently helping. Taking Yaz as directed.) and Rash (Dx with folliculitis by Dr. Nehemiah Massed. Has been using Sulfa wash. Does not seem to be helping. Takes Doxycycline 100mg  BID PRN.).    The following portions of the chart were reviewed this encounter and updated as appropriate:      Review of Systems: No other skin or systemic complaints except as noted in HPI or Assessment and Plan.  Objective  Well appearing patient in no apparent distress; mood and affect are within normal limits.  A focused examination was performed including face and legs. Relevant physical exam findings are noted in the Assessment and Plan.  Objective  face: Cystic papule at left temple. Few small inflammatory papules at nose and left cheek.  Objective  legs: Few scattered crusted and excoriated papules at thighs c/w older lesions. One inflammatory papule at left thigh.   Objective  Right paranasal: 52mm pink/red papule- recently picked at it and bled  Assessment & Plan  Acne vulgaris face  Mild/moderate, with flare  Start Benzaclin gel qam Start Aklief cream qhs as tolerated  D/C Dapsone 5% gel for now  Continue Yaz as directed by PCP.  Pt has  Continue Sulfa wash as directed. Pt has.  Topical retinoid medications like tretinoin/Retin-A, adapalene/Differin, tazarotene/Fabior, and Epiduo/Epiduo Forte can cause dryness and irritation when first started. Only apply a pea-sized amount to the entire affected area. Avoid applying it around the eyes, edges of mouth and creases at the nose. If you experience irritation, use a good moisturizer first and/or apply the medicine less often. If you are doing well with the medicine, you can increase how often you use it until you are applying every night. Be careful with sun protection  while using this medication as it can make you sensitive to the sun. This medicine should not be used by pregnant women.    Benzoyl peroxide can cause dryness and irritation of the skin. It can also bleach fabric. When used together with Aczone (dapsone) cream, it can stain the skin orange.   Clindamycin Phos-Benzoyl Perox gel - face  Trifarotene (AKLIEF) 0.005 % CREA - face  Other Related Medications Sulfacetamide Sodium-Sulfur (SULFACLEANSE 8/4) 8-4 % SUSP  Folliculitis legs  Chronic recurrent condition- with recent flare, now resolving  Continue Sulfa wash in shower qd.  Continue Clindamycin lotion qd-bid to legs, rfs sent  Continue Doxycycline 100 mg PO bid prn flares, as directed by PCP, pt has   clindamycin (CLEOCIN T) 1 % lotion - legs  Other Related Medications doxycycline (VIBRA-TABS) 100 MG tablet  Fibrous papule of skin Right paranasal  Benign-appearing, observe.  Avoid picking at area.  Recheck on f/up  Discussed shave removal if irritated. Potential for resulting small scar discussed.   Return in about 3 months (around 09/16/2020) for TBSE as scheduled in June.   I, Emelia Salisbury, CMA, am acting as scribe for Brendolyn Patty, MD.  Documentation: I have reviewed the above documentation for accuracy and completeness, and I agree with the above.  Brendolyn Patty MD

## 2020-06-16 NOTE — Patient Instructions (Addendum)

## 2020-08-27 DIAGNOSIS — J358 Other chronic diseases of tonsils and adenoids: Secondary | ICD-10-CM | POA: Diagnosis not present

## 2020-09-06 ENCOUNTER — Other Ambulatory Visit: Payer: Self-pay | Admitting: Nurse Practitioner

## 2020-09-06 DIAGNOSIS — Z30011 Encounter for initial prescription of contraceptive pills: Secondary | ICD-10-CM

## 2020-09-23 ENCOUNTER — Ambulatory Visit: Payer: BC Managed Care – PPO | Admitting: Physician Assistant

## 2020-09-23 ENCOUNTER — Encounter: Payer: Self-pay | Admitting: Physician Assistant

## 2020-09-23 ENCOUNTER — Other Ambulatory Visit: Payer: Self-pay

## 2020-09-23 DIAGNOSIS — N6322 Unspecified lump in the left breast, upper inner quadrant: Secondary | ICD-10-CM

## 2020-09-23 DIAGNOSIS — Z30011 Encounter for initial prescription of contraceptive pills: Secondary | ICD-10-CM

## 2020-09-23 DIAGNOSIS — Z124 Encounter for screening for malignant neoplasm of cervix: Secondary | ICD-10-CM

## 2020-09-23 DIAGNOSIS — N644 Mastodynia: Secondary | ICD-10-CM | POA: Diagnosis not present

## 2020-09-23 MED ORDER — DROSPIRENONE-ETHINYL ESTRADIOL 3-0.02 MG PO TABS: 1.0000 | ORAL_TABLET | Freq: Every day | ORAL | 3 refills | Status: DC

## 2020-09-23 NOTE — Progress Notes (Signed)
[facility] [place], [place] [address]  Internal MEDICINE  Office Visit Note  Patient Name: Kim Shannon  [id number]  [id number]  Date of Service: 09/26/2020  Chief Complaint  Patient presents with   Acute Visit    Discuss birth control medication   Breast Problem    Soreness on left side     HPI Pt is here for a sick visit. -Breast tenderness left side for the last 2 weeks. Cycle ended about 1.5 weeks ago.  -This has happened before, 2 years ago and found a lump and had an [place] with biopsy which was benign at that time.  -Paternal GM had breast cancer, and mom was adopted and does not know her medical hx -Had been on [facility] 10 years and considering pregnancy in the next year. Wondering when to stop BC pill. Discussed that cycle can be irregular for awhile after stopping pill, and a back up method of contraception should be used until ready to conceive. Pt would like to remain on OCP for a few more months.  Current Medication:  Outpatient Encounter Medications as of 09/23/2020  Medication Sig   clindamycin (CLEOCIN T) 1 % lotion Apply topically 2 (two) times daily.   Clindamycin Phos-Benzoyl Perox gel Apply QAM to face   doxycycline (VIBRA-TABS) 100 MG tablet Take 1 tablet (100 mg total) by mouth daily.   Sulfacetamide Sodium-Sulfur (SULFACLEANSE 8/4) 8-4 % SUSP Wash the face and chest daily with medication   [DISCONTINUED] drospirenone-ethinyl estradiol (YAZ) 3-0.02 MG tablet Take 1 tablet by mouth daily.   drospirenone-ethinyl estradiol (YAZ) 3-0.02 MG tablet Take 1 tablet by mouth daily.   [DISCONTINUED] Trifarotene (AKLIEF) 0.005 % CREA Apply QHS to face   No facility-administered encounter medications on file as of 09/23/2020.      Medical History: Past Medical History:  Diagnosis Date   Dysplastic nevus 03/24/2015   left side above waistline, mild atypia, lateral margin involved.     Vital Signs: BP 109/77   Pulse 78   Temp 97.8 F  (36.6 C)   Resp 16   Ht 5\' 6"  (1.676 m)   Wt 173 lb (78.5 kg)   SpO2 98%   BMI 27.92 kg/m    Review of Systems  Constitutional:  Negative for chills, fatigue and unexpected weight change.       Breast tenderness on left side  HENT:  Negative for congestion, postnasal drip, rhinorrhea, sneezing and sore throat.   Eyes:  Negative for redness.  Respiratory:  Negative for cough, chest tightness and shortness of breath.   Cardiovascular:  Negative for chest pain and palpitations.  Gastrointestinal:  Negative for abdominal pain, constipation, diarrhea, nausea and vomiting.  Genitourinary:  Negative for dysuria and frequency.  Musculoskeletal:  Negative for arthralgias, back pain, joint swelling and neck pain.  Skin:  Negative for rash.  Neurological: Negative.  Negative for tremors and numbness.  Hematological:  Negative for adenopathy. Does not bruise/bleed easily.  Psychiatric/Behavioral:  Negative for behavioral problems (Depression), sleep disturbance and suicidal ideas. The patient is not nervous/anxious.    Physical Exam Vitals and nursing note reviewed.  Constitutional:      General: She is not in acute distress.    Appearance: She is well-developed. She is not diaphoretic.  HENT:     Head: Normocephalic and atraumatic.     Mouth/Throat:     Pharynx: No oropharyngeal exudate.  Eyes:     Pupils: Pupils are equal, round, and reactive  to light.  Neck:     Thyroid: No thyromegaly.     Vascular: No JVD.     Trachea: No tracheal deviation.  Cardiovascular:     Rate and Rhythm: Normal rate and regular rhythm.     Heart sounds: Normal heart sounds. No murmur heard.   No friction rub. No gallop.  Pulmonary:     Effort: Pulmonary effort is normal. No respiratory distress.     Breath sounds: No wheezing or rales.  Chest:     Chest wall: No tenderness.  Breasts:    Right: Normal. No nipple discharge, skin change or tenderness.     Left: Mass and tenderness present. No nipple  discharge or skin change.     Comments: Small lump on upper inner quadrant of left breast, minimal tenderness on palpation Abdominal:     General: Bowel sounds are normal.     Palpations: Abdomen is soft.  Musculoskeletal:        General: Normal range of motion.     Cervical back: Normal range of motion and neck supple.  Lymphadenopathy:     Cervical: No cervical adenopathy.  Skin:    General: Skin is warm and dry.  Neurological:     Mental Status: She is alert and oriented to person, place, and time.     Cranial Nerves: No cranial nerve deficit.  Psychiatric:        Behavior: Behavior normal.        Thought Content: Thought content normal.        Judgment: Judgment normal.      Assessment/Plan: 1. Encounter for prescription of oral contraceptives Discussed continuing OCP and requesting refill today, Educated to use back up method if she decides to stop OCP until ready to conceive - drospirenone-ethinyl estradiol (YAZ) 3-0.02 MG tablet; Take 1 tablet by mouth daily.  Dispense: 90 tablet; Refill: 3  2. Mass of upper inner quadrant of left breast Same location of prrevious benign finding 2 years ago however new tenderness. Will order a mammogram to re-evaluate - MM DIAG BREAST TOMO BILATERAL; Future  3. Breast tenderness in female Will order mammogram to re-evaluate - MM DIAG BREAST TOMO BILATERAL; Future   General Counseling: Kim Shannon verbalizes understanding of the findings of todays visit and agrees with plan of treatment. I have discussed any further diagnostic evaluation that may be needed or ordered today. We also reviewed her medications today. she has been encouraged to call the office with any questions or concerns that should arise related to todays visit.    Counseling:    Orders Placed This Encounter  Procedures   MM DIAG BREAST TOMO BILATERAL     Meds ordered this encounter  Medications   drospirenone-ethinyl estradiol (YAZ) 3-0.02 MG tablet    Sig: Take  1 tablet by mouth daily.    Dispense:  90 tablet    Refill:  3     Time spent:30 Minutes

## 2020-09-30 ENCOUNTER — Other Ambulatory Visit: Payer: Self-pay

## 2020-09-30 ENCOUNTER — Ambulatory Visit (INDEPENDENT_AMBULATORY_CARE_PROVIDER_SITE_OTHER): Payer: BC Managed Care – PPO | Admitting: Dermatology

## 2020-09-30 ENCOUNTER — Encounter: Payer: Self-pay | Admitting: Dermatology

## 2020-09-30 DIAGNOSIS — D229 Melanocytic nevi, unspecified: Secondary | ICD-10-CM

## 2020-09-30 DIAGNOSIS — L813 Cafe au lait spots: Secondary | ICD-10-CM

## 2020-09-30 DIAGNOSIS — Z86018 Personal history of other benign neoplasm: Secondary | ICD-10-CM

## 2020-09-30 DIAGNOSIS — L814 Other melanin hyperpigmentation: Secondary | ICD-10-CM

## 2020-09-30 DIAGNOSIS — D485 Neoplasm of uncertain behavior of skin: Secondary | ICD-10-CM | POA: Diagnosis not present

## 2020-09-30 DIAGNOSIS — L811 Chloasma: Secondary | ICD-10-CM | POA: Diagnosis not present

## 2020-09-30 DIAGNOSIS — D1800 Hemangioma unspecified site: Secondary | ICD-10-CM

## 2020-09-30 DIAGNOSIS — Z1283 Encounter for screening for malignant neoplasm of skin: Secondary | ICD-10-CM | POA: Diagnosis not present

## 2020-09-30 DIAGNOSIS — L578 Other skin changes due to chronic exposure to nonionizing radiation: Secondary | ICD-10-CM

## 2020-09-30 NOTE — Patient Instructions (Addendum)
Melanoma ABCDEs  Melanoma is the most dangerous type of skin cancer, and is the leading cause of death from skin disease.  You are more likely to develop melanoma if you: Have light-colored skin, light-colored eyes, or red or blond hair Spend a lot of time in the sun Tan regularly, either outdoors or in a tanning bed Have had blistering sunburns, especially during childhood Have a close family member who has had a melanoma Have atypical moles or large birthmarks  Early detection of melanoma is key since treatment is typically straightforward and cure rates are extremely high if we catch it early.   The first sign of melanoma is often a change in a mole or a new dark spot.  The ABCDE system is a way of remembering the signs of melanoma.  A for asymmetry:  The two halves do not match. B for border:  The edges of the growth are irregular. C for color:  A mixture of colors are present instead of an even brown color. D for diameter:  Melanomas are usually (but not always) greater than 67mm - the size of a pencil eraser. E for evolution:  The spot keeps changing in size, shape, and color.  Please check your skin once per month between visits. You can use a small mirror in front and a large mirror behind you to keep an eye on the back side or your body.   If you see any new or changing lesions before your next follow-up, please call to schedule a visit.  Please continue daily skin protection including broad spectrum sunscreen SPF 30+ to sun-exposed areas, reapplying every 2 hours as needed when you're outdoors.    Recommend taking Heliocare sun protection supplement daily in sunny weather for additional sun protection. For maximum protection on the sunniest days, you can take up to 2 capsules of regular Heliocare OR take 1 capsule of Heliocare Ultra. For prolonged exposure (such as a full day in the sun), you can repeat your dose of the supplement 4 hours after your first dose. Heliocare can be  purchased at [facility] or at [email or web address].   If you have any questions or concerns for your doctor, please call our main line at [phone] and press option 4 to reach your doctor's medical assistant. If no one answers, please leave a voicemail as directed and we will return your call as soon as possible. Messages left after 4 pm will be answered the following business day.   You may also send [place] a message via [place]. We typically respond to MyChart messages within 1-2 business days.  For prescription refills, please ask your pharmacy to contact our office. Our fax number is [phone].  If you have an urgent issue when the clinic is closed that cannot wait until the next business day, you can page your doctor at the number below.    Please note that while we do our best to be available for urgent issues outside of office hours, we are not available 24/7.   If you have an urgent issue and are unable to reach [place], you may choose to seek medical care at your doctor's office, retail clinic, urgent care center, or emergency room.  If you have a medical emergency, please immediately call 911 or go to the emergency department.  Pager Numbers  - Dr. Nehemiah Massed: [phone]  - Dr. Laurence Ferrari: [phone]  - Dr. Nicole Kindred: [phone]  In the event of inclement weather, please call our main line at  [phone] for an update on the status of any delays or closures.  Dermatology Medication Tips: Please keep the boxes that topical medications come in in order to help keep track of the instructions about where and how to use these. Pharmacies typically print the medication instructions only on the boxes and not directly on the medication tubes.   If your medication is too expensive, please contact our office at [phone] option 4 or send [place] a message through [place].   We are unable to tell what your co-pay for medications will be in advance as this is different depending on your  insurance coverage. However, we may be able to find a substitute medication at lower cost or fill out paperwork to get insurance to cover a needed medication.   If a prior authorization is required to get your medication covered by your insurance company, please allow [place] 1-2 business days to complete this process.  Drug prices often vary depending on where the prescription is filled and some pharmacies may offer cheaper prices.  The website www.goodrx.com contains coupons for medications through different pharmacies. The prices here do not account for what the cost may be with help from insurance (it may be cheaper with your insurance), but the website can give you the price if you did not use any insurance.  - You can print the associated coupon and take it with your prescription to the pharmacy.  - You may also stop by our office during regular business hours and pick up a GoodRx coupon card.  - If you need your prescription sent electronically to a different pharmacy, notify our office through [facility] or by phone at [phone] option 4.

## 2020-09-30 NOTE — Progress Notes (Signed)
   Follow-Up Visit   Subjective  Kim Shannon is a 25 y.o. female who presents for the following: FBSE (Patient here for full body skin exam and skin cancer screening. Patient with hx of dysplastic nevus. No new or changing spots that patient is aware of. ).   The following portions of the chart were reviewed this encounter and updated as appropriate:   Tobacco  Allergies  Meds  Problems  Med Hx  Surg Hx  Fam Hx       Review of Systems:  No other skin or systemic complaints except as noted in HPI or Assessment and Plan.  Objective  Well appearing patient in no apparent distress; mood and affect are within normal limits.  A full examination was performed including scalp, head, eyes, ears, nose, lips, neck, chest, axillae, abdomen, back, buttocks, bilateral upper extremities, bilateral lower extremities, hands, feet, fingers, toes, fingernails, and toenails. All findings within normal limits unless otherwise noted below.  forehead Reticulated hyperpigmented patches.   right nasofacial angle 0.15cm erythematous papule   Assessment & Plan  Melasma forehead  Patient deferred treatment at this time.    Neoplasm of uncertain behavior of skin right nasofacial angle  Symptomatic R/o Irritated Fibrous Papule vs other Plan shave removal at follow-up  Lentigines - Scattered tan macules - Due to sun exposure - Benign-appering, observe - Recommend daily broad spectrum sunscreen SPF 30+ to sun-exposed areas, reapply every 2 hours as needed. - Call for any changes  Melanocytic Nevi - Tan-brown and/or pink-flesh-colored symmetric macules and papules - Benign appearing on exam today - Observation - Call clinic for new or changing moles - Recommend daily use of broad spectrum spf 30+ sunscreen to sun-exposed areas.   Hemangiomas - Red papules - Discussed benign nature - Observe - Call for any changes  Actinic Damage - Chronic condition, secondary to  cumulative UV/sun exposure - diffuse scaly erythematous macules with underlying dyspigmentation - Recommend daily broad spectrum sunscreen SPF 30+ to sun-exposed areas, reapply every 2 hours as needed.  - Staying in the shade or wearing long sleeves, sun glasses (UVA+UVB protection) and wide brim hats (4-inch brim around the entire circumference of the hat) are also recommended for sun protection.  - Call for new or changing lesions.  Skin cancer screening performed today.  Cafe au Lait  - Tan patch at right lower back - Genetic - Benign, observe - Call for any changes  History of Dysplastic Nevi - No evidence of recurrence today at left side above waistline, mild - Recommend regular full body skin exams - Recommend daily broad spectrum sunscreen SPF 30+ to sun-exposed areas, reapply every 2 hours as needed.  - Call if any new or changing lesions are noted between office visits   Return in about 1 year (around 09/30/2021) for TBSE, August for bx.  Graciella Belton, RMA, am acting as scribe for Forest Gleason, MD .  Documentation: I have reviewed the above documentation for accuracy and completeness, and I agree with the above.  Forest Gleason, MD

## 2020-10-21 ENCOUNTER — Telehealth: Payer: Self-pay

## 2020-10-21 ENCOUNTER — Other Ambulatory Visit: Payer: Self-pay | Admitting: Physician Assistant

## 2020-10-21 DIAGNOSIS — N644 Mastodynia: Secondary | ICD-10-CM

## 2020-10-21 DIAGNOSIS — N6322 Unspecified lump in the left breast, upper inner quadrant: Secondary | ICD-10-CM

## 2020-10-26 ENCOUNTER — Telehealth: Payer: Self-pay

## 2020-10-26 NOTE — Telephone Encounter (Signed)
Gave signed Mammogram order to Vivien Rota to Fax to [facility] location. Loma Sousa

## 2020-10-26 NOTE — Telephone Encounter (Signed)
Faxed mammogram order to [place]

## 2020-10-28 NOTE — Telephone Encounter (Signed)
done

## 2020-11-23 ENCOUNTER — Ambulatory Visit (INDEPENDENT_AMBULATORY_CARE_PROVIDER_SITE_OTHER): Payer: BC Managed Care – PPO | Admitting: Dermatology

## 2020-11-23 ENCOUNTER — Other Ambulatory Visit: Payer: Self-pay

## 2020-11-23 DIAGNOSIS — D2239 Melanocytic nevi of other parts of face: Secondary | ICD-10-CM | POA: Diagnosis not present

## 2020-11-23 DIAGNOSIS — D485 Neoplasm of uncertain behavior of skin: Secondary | ICD-10-CM

## 2020-11-23 NOTE — Patient Instructions (Addendum)
Wound Care Instructions  Cleanse wound gently with soap and water once a day then pat dry with clean gauze. Apply a thing coat of Petrolatum (petroleum jelly, "Vaseline") over the wound (unless you have an allergy to this). We recommend that you use a new, sterile tube of Vaseline. Do not pick or remove scabs. Do not remove the yellow or white "healing tissue" from the base of the wound.  Cover the wound with fresh, clean, nonstick gauze and secure with paper tape. You may use Band-Aids in place of gauze and tape if the would is small enough, but would recommend trimming much of the tape off as there is often too much. Sometimes Band-Aids can irritate the skin.  You should call the office for your biopsy report after 1 week if you have not already been contacted.  If you experience any problems, such as abnormal amounts of bleeding, swelling, significant bruising, significant pain, or evidence of infection, please call the office immediately.  FOR ADULT SURGERY PATIENTS: If you need something for pain relief you may take 1 extra strength Tylenol (acetaminophen) AND 2 Ibuprofen (200mg each) together every 4 hours as needed for pain. (do not take these if you are allergic to them or if you have a reason you should not take them.) Typically, you may only need pain medication for 1 to 3 days.   If you have any questions or concerns for your doctor, please call our main line at [phone] and press option 4 to reach your doctor's medical assistant. If no one answers, please leave a voicemail as directed and we will return your call as soon as possible. Messages left after 4 pm will be answered the following business day.   You may also send us a message via MyChart. We typically respond to MyChart messages within 1-2 business days.  For prescription refills, please ask your pharmacy to contact our office. Our fax number is [phone].  If you have an urgent issue when the clinic is closed that  cannot wait until the next business day, you can page your doctor at the number below.    Please note that while we do our best to be available for urgent issues outside of office hours, we are not available 24/7.   If you have an urgent issue and are unable to reach us, you may choose to seek medical care at your doctor's office, retail clinic, urgent care center, or emergency room.  If you have a medical emergency, please immediately call 911 or go to the emergency department.  Pager Numbers  - Dr. Kowalski: [phone]  - Dr. Moye: [phone]  - Dr. Stewart: [phone]  In the event of inclement weather, please call our main line at [phone] for an update on the status of any delays or closures.  Dermatology Medication Tips: Please keep the boxes that topical medications come in in order to help keep track of the instructions about where and how to use these. Pharmacies typically print the medication instructions only on the boxes and not directly on the medication tubes.   If your medication is too expensive, please contact our office at [phone] option 4 or send us a message through MyChart.   We are unable to tell what your co-pay for medications will be in advance as this is different depending on your insurance coverage. However, we may be able to find a substitute medication at lower cost or fill out paperwork to get insurance to cover a needed   medication.   If a prior authorization is required to get your medication covered by your insurance company, please allow us 1-2 business days to complete this process.  Drug prices often vary depending on where the prescription is filled and some pharmacies may offer cheaper prices.  The website www.goodrx.com contains coupons for medications through different pharmacies. The prices here do not account for what the cost may be with help from insurance (it may be cheaper with your insurance), but the website can give you the  price if you did not use any insurance.  - You can print the associated coupon and take it with your prescription to the pharmacy.  - You may also stop by our office during regular business hours and pick up a GoodRx coupon card.  - If you need your prescription sent electronically to a different pharmacy, notify our office through [place] MyChart or by phone at [phone] option 4.   

## 2020-11-23 NOTE — Progress Notes (Signed)
   Follow-Up Visit   Subjective  Kim Shannon is a 25 y.o. female who presents for the following: Procedure (Patient here today for shave removal at right nasofacial angle. ).   The following portions of the chart were reviewed this encounter and updated as appropriate:   Tobacco  Allergies  Meds  Problems  Med Hx  Surg Hx  Fam Hx      Review of Systems:  No other skin or systemic complaints except as noted in HPI or Assessment and Plan.  Objective  Well appearing patient in no apparent distress; mood and affect are within normal limits.  A focused examination was performed including face. Relevant physical exam findings are noted in the Assessment and Plan.  Right nasofacial angle 0.15cm erythematous papule      Assessment & Plan  Neoplasm of uncertain behavior of skin Right nasofacial angle  Epidermal / dermal shaving  Lesion diameter (cm):  0.2 Informed consent: discussed and consent obtained   Timeout: patient name, date of birth, surgical site, and procedure verified   Patient was prepped and draped in usual sterile fashion: area prepped with isopropyl alcohol. Anesthesia: the lesion was anesthetized in a standard fashion   Anesthetic:  1% lidocaine w/ epinephrine 1-100,000 buffered w/ 8.4% NaHCO3 Instrument used: flexible razor blade   Hemostasis achieved with: aluminum chloride   Outcome: patient tolerated procedure well   Post-procedure details: wound care instructions given   Additional details:  Mupirocin and a bandage applied  Specimen 1 - Surgical pathology Differential Diagnosis: R/o Irritated Fibrous Papule vs other  Check Margins: No 0.15cm erythematous papule  Return in about 10 months (around 09/23/2021) for TBSE.  Graciella Belton, RMA, am acting as scribe for Forest Gleason, MD .  Documentation: I have reviewed the above documentation for accuracy and completeness, and I agree with the above.  Forest Gleason, MD

## 2020-11-29 ENCOUNTER — Encounter: Payer: Self-pay | Admitting: Dermatology

## 2020-12-01 ENCOUNTER — Telehealth: Payer: Self-pay

## 2020-12-01 NOTE — Telephone Encounter (Signed)
-----   Message from [place], MD sent at 11/30/2020  8:21 PM EDT ----- Skin , right nasofacial angle FIBROUS PAPULE --> benign small growth commonly found on the nose, no additional treatment needed  MAs please call. Thank you!

## 2020-12-01 NOTE — Telephone Encounter (Signed)
Tried calling patient. No answer. Left message for patient to call office.

## 2020-12-01 NOTE — Telephone Encounter (Signed)
Patient advised of BX results.  °

## 2020-12-03 DIAGNOSIS — N6320 Unspecified lump in the left breast, unspecified quadrant: Secondary | ICD-10-CM | POA: Diagnosis not present

## 2020-12-03 DIAGNOSIS — N644 Mastodynia: Secondary | ICD-10-CM | POA: Diagnosis not present

## 2020-12-03 DIAGNOSIS — N6322 Unspecified lump in the left breast, upper inner quadrant: Secondary | ICD-10-CM | POA: Diagnosis not present

## 2021-01-12 IMAGING — US US BREAST*L* LIMITED INC AXILLA
1 series · 9 of 9 positions shown · non-contrast
Comparison: None.

CLINICAL DATA: Patient presents for a diagnostic left breast
ultrasound due to a palpable abnormality over the upper inner
quadrant of the left breast felt by her physician. Family history
breast cancer in her paternal grandmother diagnosed at age 65.

EXAM:
ULTRASOUND OF THE LEFT BREAST

[Series 1: us breast*left* limited inc axilla · 0.05mm/px · 9 of 9 slices shown]
[im 1/9]
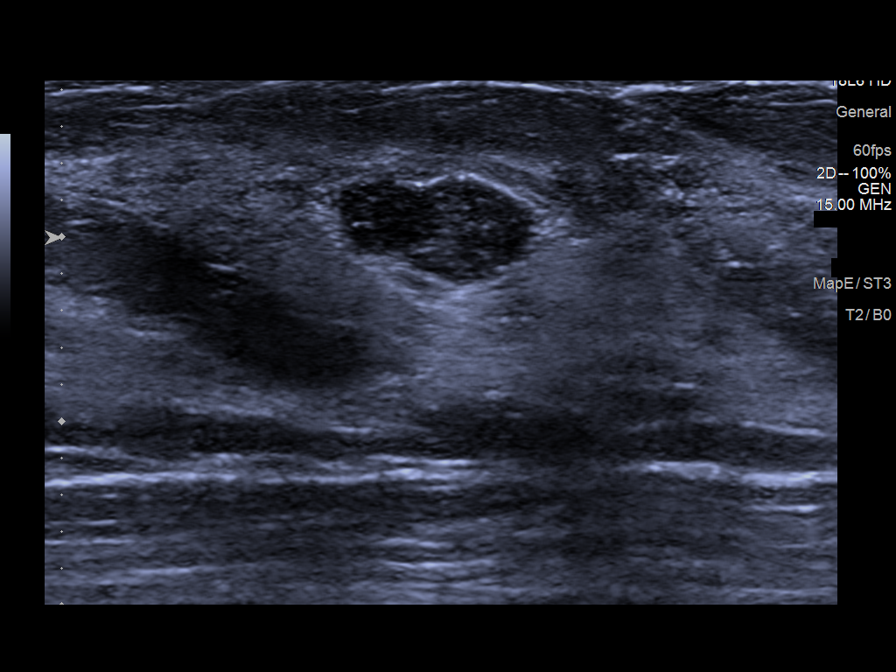
[im 2/9]
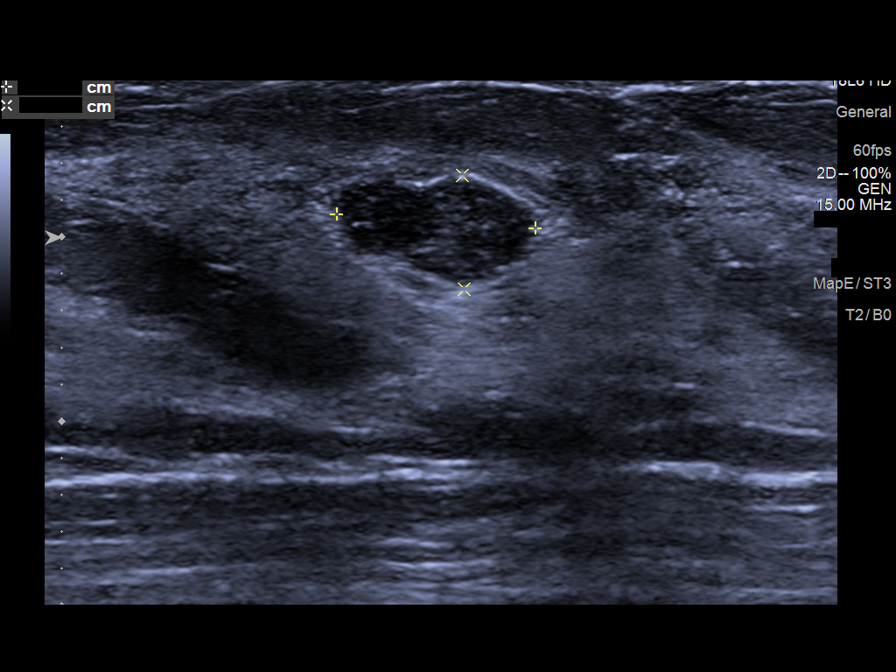
[im 3/9]
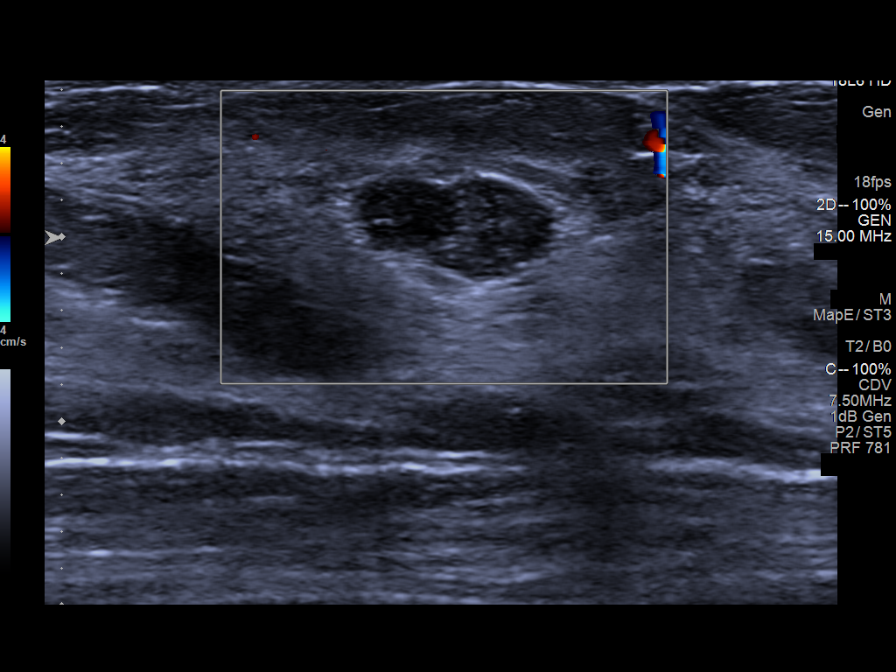
[im 4/9]
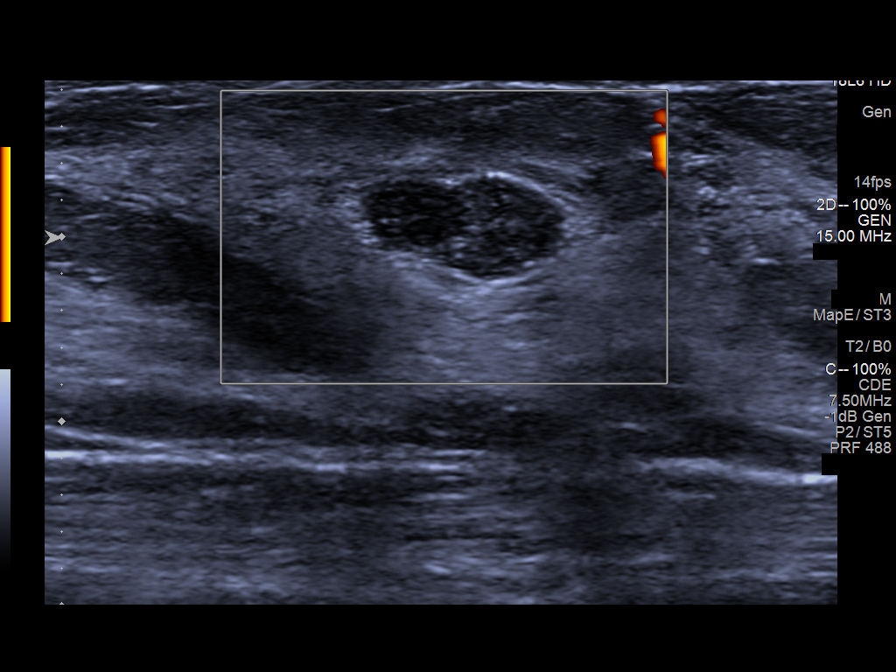
[im 5/9]
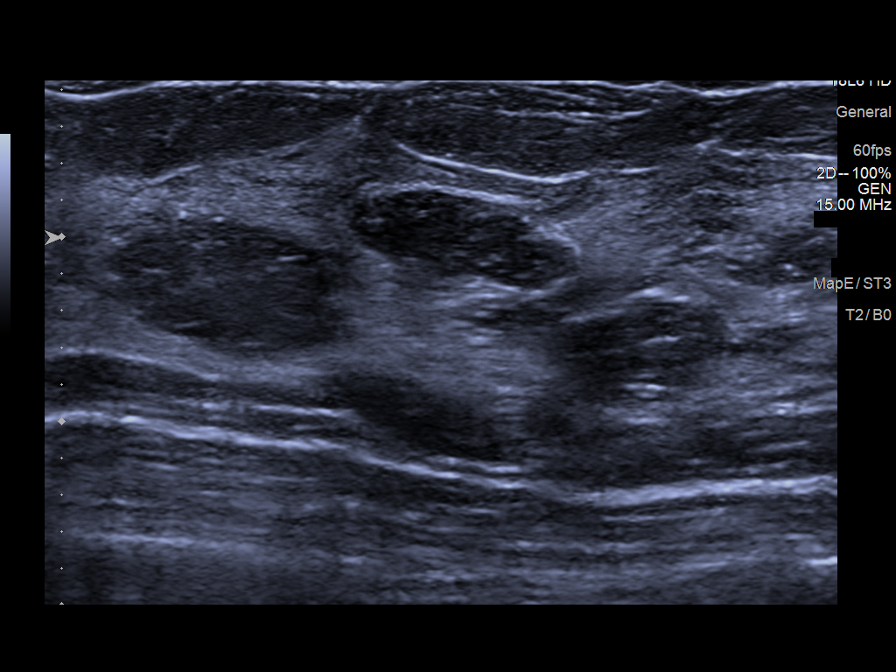
[im 6/9]
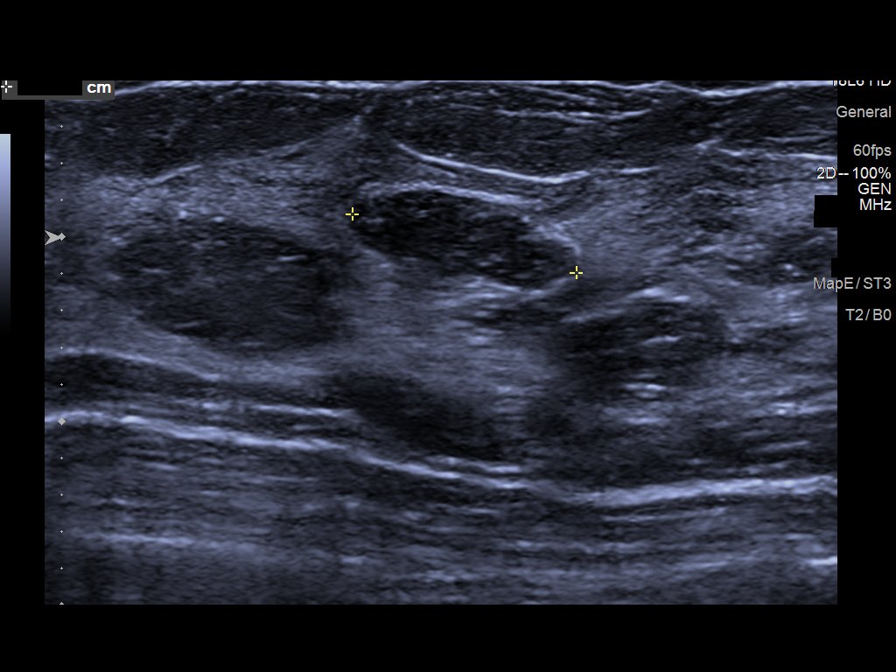
[im 7/9]
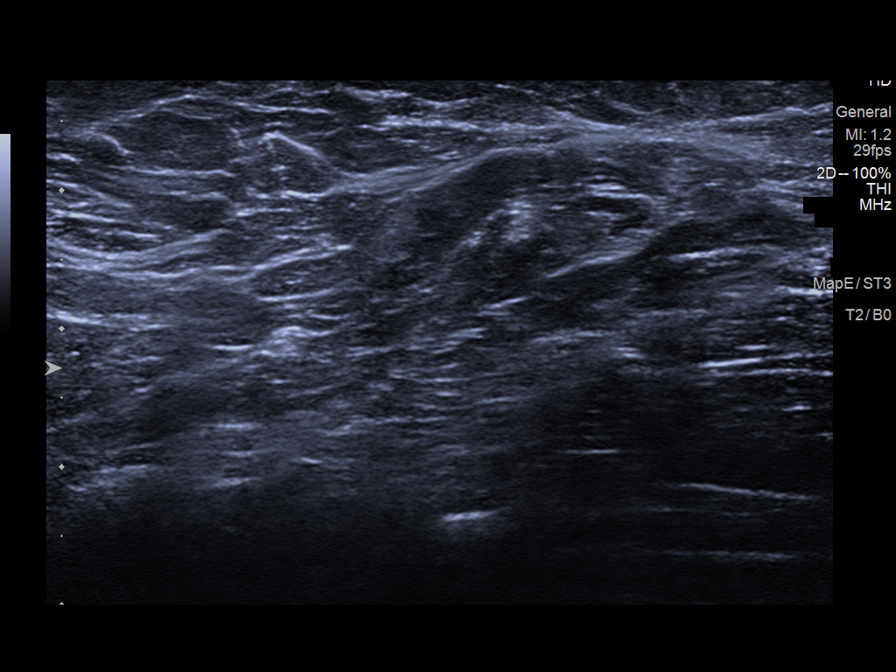
[im 8/9]
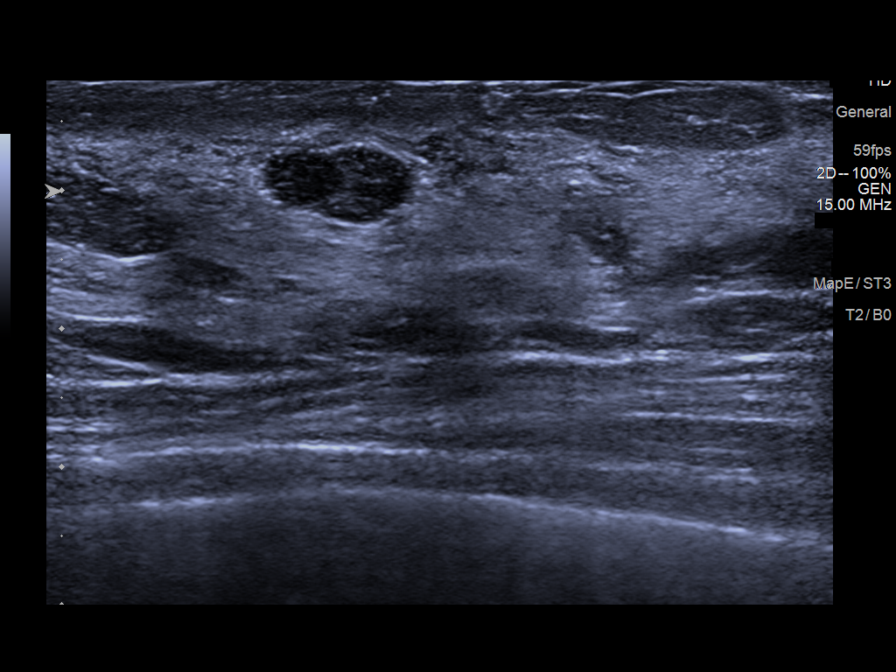
[im 9/9]
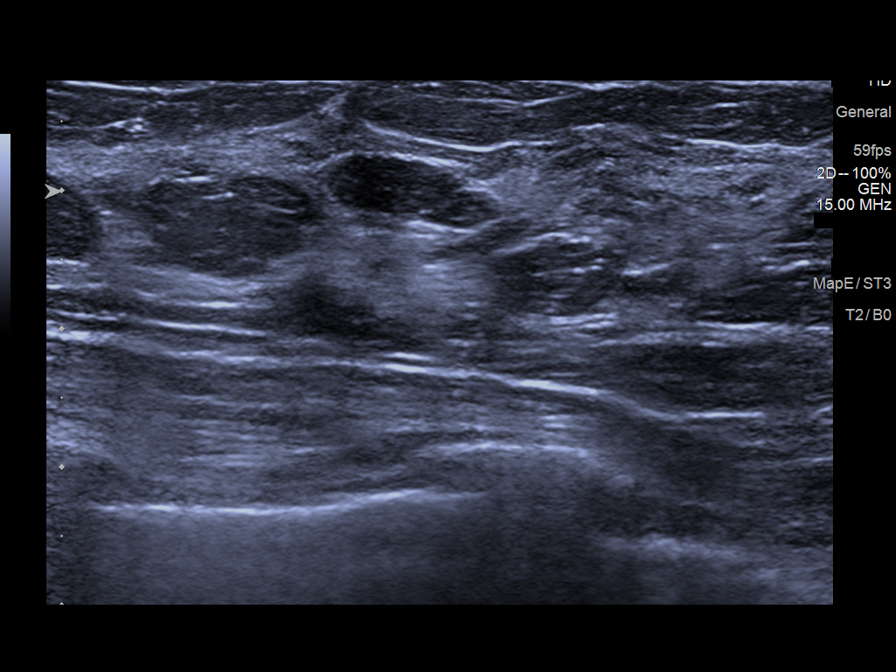

[9 of 9 positions shown; findings below may reference images not displayed]

FINDINGS: On physical exam, I palpate a 1 cm mobile soft mass over the 11
o'clock position of the left breast 3-4 cm from the nipple.

Targeted ultrasound is performed, showing an oval circumscribed
hypoechoic mass with parallel long axis and increased through
transmission at the 11 o'clock position of the left breast 3 cm from
the nipple. This measures 0.6 x 1.1 x 1.3 cm correlating to
patient's palpable abnormality. This likely represents a benign mass
such as a fibroadenoma.

Ultrasound of the left axilla is normal.
IMPRESSION: Probable benign 1.3 cm mass at the 11 o'clock position of the left
breast correlating to patient's palpable abnormality.

RECOMMENDATION:
Recommend a six-month follow-up diagnostic left breast ultrasound to
document stability. Patient was instructed to continue self-breast
examination and to return sooner if significant increase in size.

I have discussed the findings and recommendations with the patient.
If applicable, a reminder letter will be sent to the patient
regarding the next appointment.

BI-RADS CATEGORY  3: Probably benign.

## 2021-01-31 IMAGING — MG US BREAST BX W LOC DEV 1ST LESION IMG BX SPEC US GUIDE*L*
1 series · 8 of 8 positions shown · non-contrast
Comparison: Previous exam(s).
COMPARISON: Previous exam(s).

Addendum:
CLINICAL DATA: Patient with indeterminate palpable left breast mass
11 o'clock position

EXAM:
ULTRASOUND GUIDED LEFT BREAST CORE NEEDLE BIOPSY

[Series 1: MG view · 0.06mm/px · 8 of 8 slices shown]
[im 1/8]
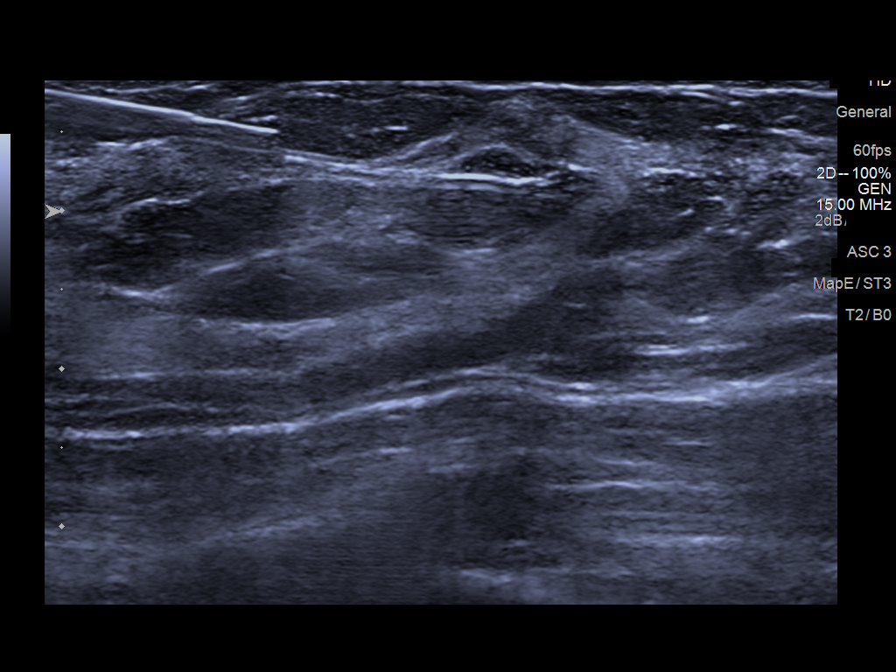
[im 2/8]
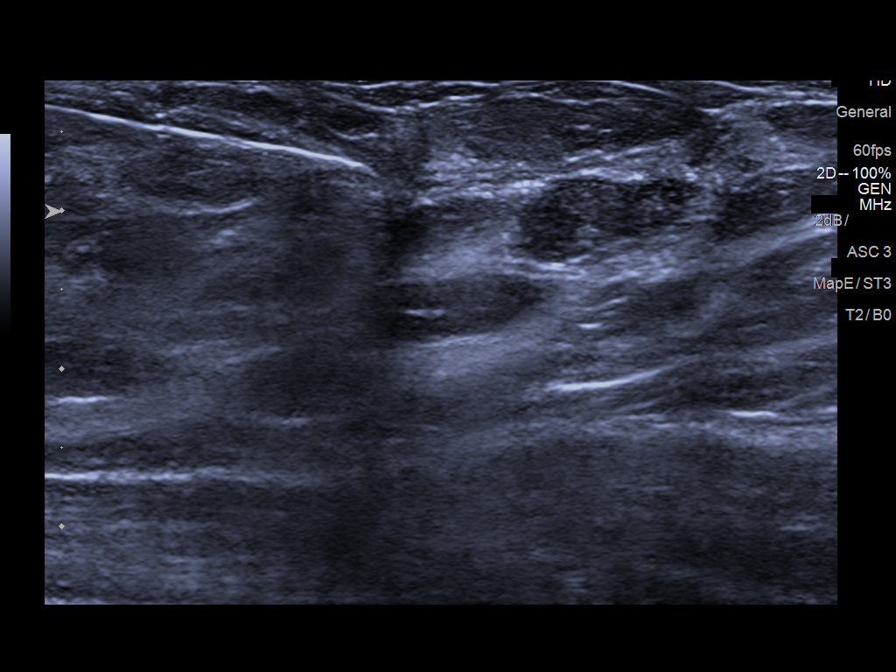
[im 3/8]
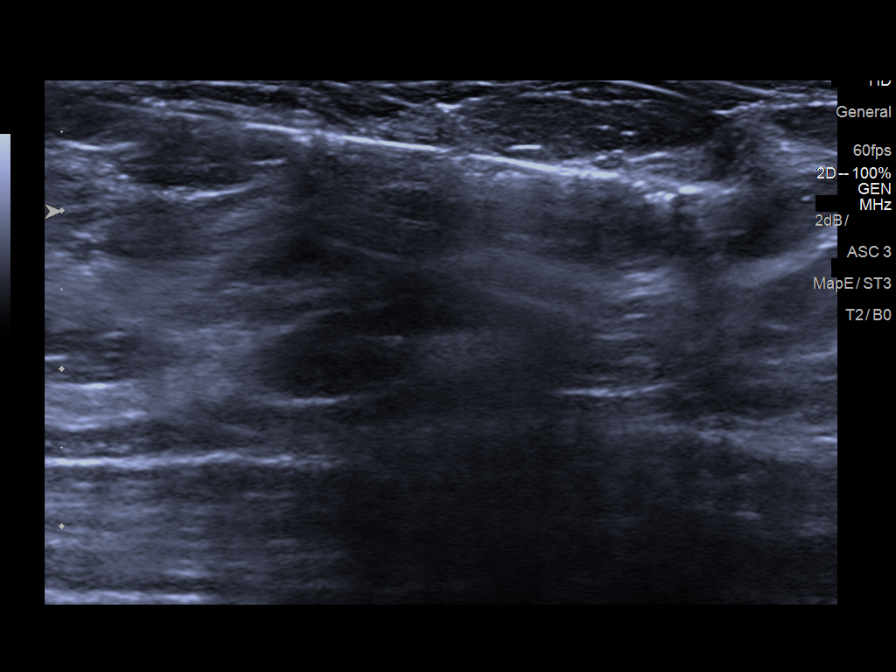
[im 4/8]
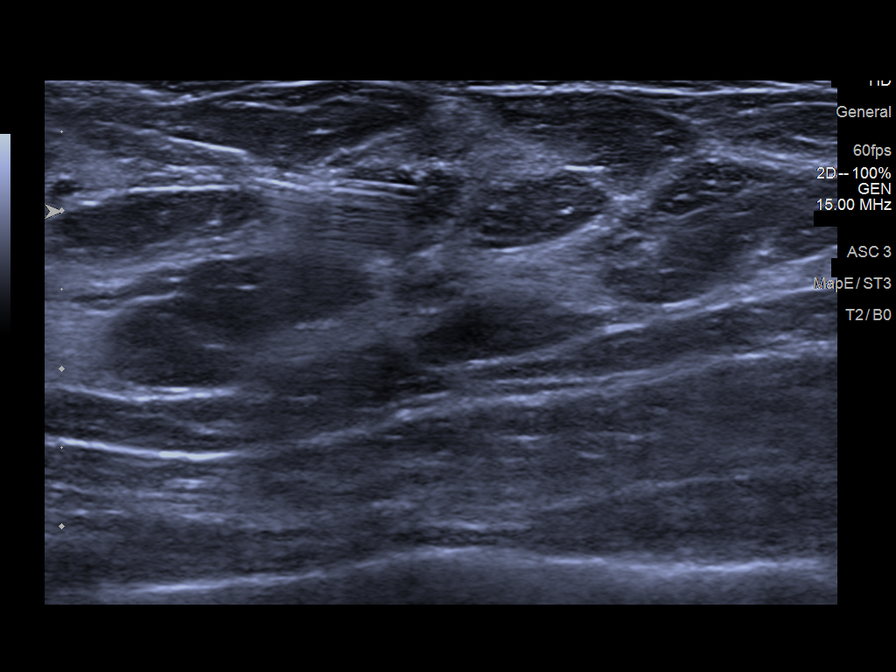
[im 5/8]
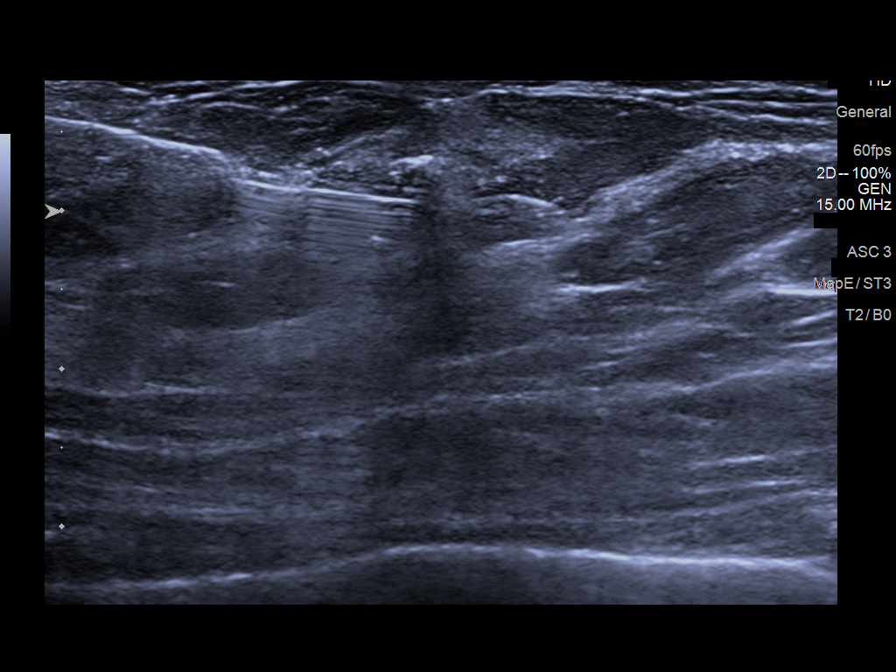
[im 6/8]
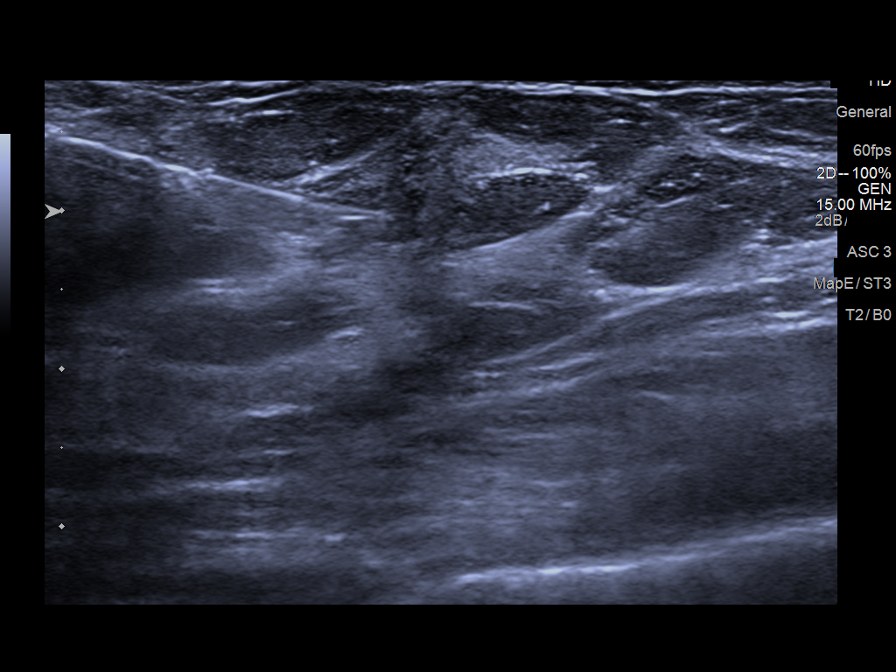
[im 7/8]
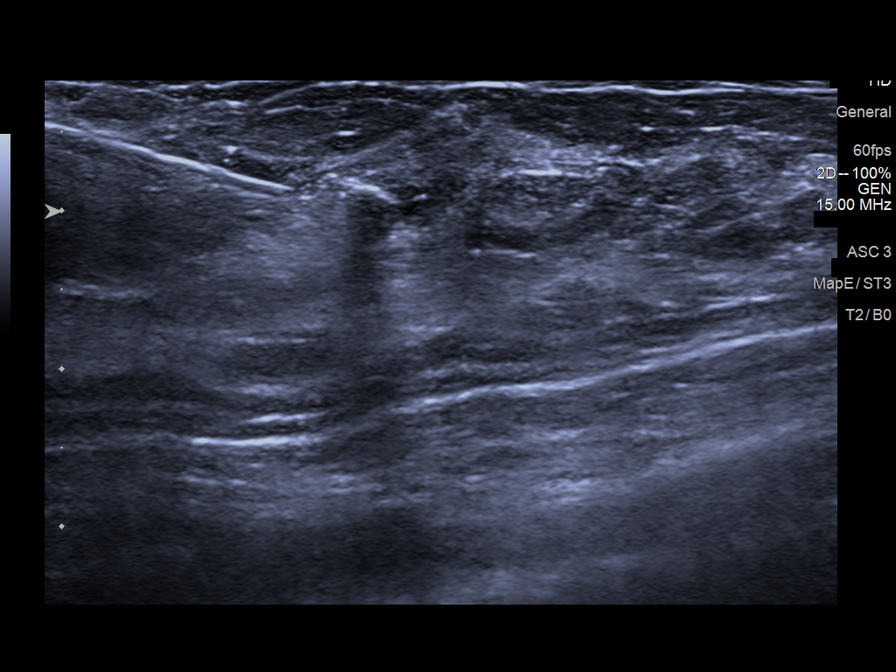
[im 8/8]
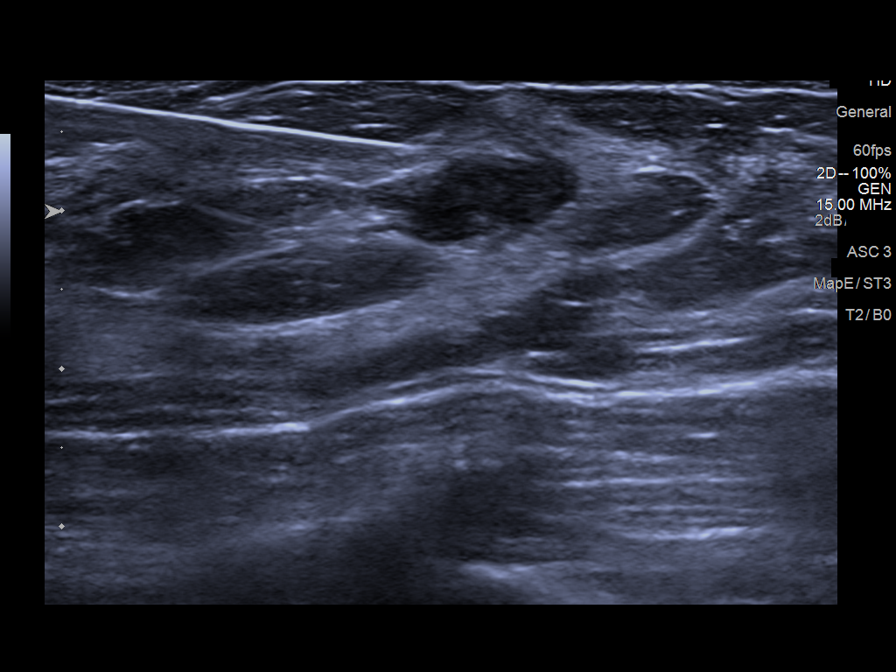

[8 of 8 positions shown; findings below may reference images not displayed]



Lesion quadrant: Upper outer quadrant

Using sterile technique and 1% Lidocaine as local anesthetic, under
direct ultrasound visualization, a 14 gauge Blain device was
used to perform biopsy of left breast mass 11 o'clock position using
a lateral approach. At the conclusion of the procedure HydroMARK
tissue marker clip was deployed into the biopsy cavity. Follow up 2
view mammogram was performed and dictated separately.
IMPRESSION: Ultrasound guided biopsy of left breast mass 11 o'clock position. No
apparent complications.

ADDENDUM:
PATHOLOGY revealed: A. BREAST, LEFT AT [DATE], 3 CM FROM THE NIPPLE;
ULTRASOUND-GUIDED CORE BIOPSY: - BENIGN FIBROEPITHELIAL LESION, MOST
COMPATIBLE WITH FIBROADENOMA. - NEGATIVE FOR ATYPICAL PROLIFERATIVE
BREAST DISEASE.

Pathology results are CONCORDANT with imaging findings, per Dr. Zafeiris
Lhamu.

Pathology results were discussed with patient via telephone. The
patient reported doing well after the biopsy with no adverse
symptoms, only tenderness at the site. Post biopsy care instructions
were reviewed and questions were answered. The patient was
encouraged to call [HOSPITAL] for any additional
questions or concerns.

Recommendation: The patient was instructed to continue with monthly
self breast examinations, clinical follow-up as needed, and to have
a bilateral screening mammogram at age 38.

Addendum by Rtoyota Joshjax RN on 02/21/2019.



Lesion quadrant: Upper outer quadrant

Using sterile technique and 1% Lidocaine as local anesthetic, under
direct ultrasound visualization, a 14 gauge Blain device was
used to perform biopsy of left breast mass 11 o'clock position using
a lateral approach. At the conclusion of the procedure HydroMARK
tissue marker clip was deployed into the biopsy cavity. Follow up 2
view mammogram was performed and dictated separately.
IMPRESSION: Ultrasound guided biopsy of left breast mass 11 o'clock position. No
apparent complications.

## 2021-02-03 ENCOUNTER — Encounter: Payer: Self-pay | Admitting: Nurse Practitioner

## 2021-02-03 ENCOUNTER — Ambulatory Visit (INDEPENDENT_AMBULATORY_CARE_PROVIDER_SITE_OTHER): Payer: BC Managed Care – PPO | Admitting: Nurse Practitioner

## 2021-02-03 ENCOUNTER — Other Ambulatory Visit: Payer: Self-pay

## 2021-02-03 VITALS — BP 115/66 | HR 75 | Temp 98.3°F | Resp 16 | Ht 66.0 in | Wt 168.8 lb

## 2021-02-03 DIAGNOSIS — Z0001 Encounter for general adult medical examination with abnormal findings: Secondary | ICD-10-CM | POA: Diagnosis not present

## 2021-02-03 DIAGNOSIS — Z124 Encounter for screening for malignant neoplasm of cervix: Secondary | ICD-10-CM

## 2021-02-03 DIAGNOSIS — R300 Dysuria: Secondary | ICD-10-CM

## 2021-02-03 DIAGNOSIS — E538 Deficiency of other specified B group vitamins: Secondary | ICD-10-CM

## 2021-02-03 DIAGNOSIS — E559 Vitamin D deficiency, unspecified: Secondary | ICD-10-CM

## 2021-02-03 DIAGNOSIS — N926 Irregular menstruation, unspecified: Secondary | ICD-10-CM

## 2021-02-03 DIAGNOSIS — Z113 Encounter for screening for infections with a predominantly sexual mode of transmission: Secondary | ICD-10-CM

## 2021-02-03 DIAGNOSIS — E782 Mixed hyperlipidemia: Secondary | ICD-10-CM | POA: Diagnosis not present

## 2021-02-03 DIAGNOSIS — Z01419 Encounter for gynecological examination (general) (routine) without abnormal findings: Secondary | ICD-10-CM

## 2021-02-03 NOTE — Progress Notes (Signed)
[facility] [place], [place] [address]  Internal MEDICINE  Office Visit Note  Patient Name: Kim Shannon  [id number]  [id number]  Date of Service: 02/03/2021  Chief Complaint  Patient presents with   Annual Exam    HPI Jezebelle presents for an annual well visit and physical exam. She is a well-appearing 25 yo female. She had no significant medical problems and no surgical history. Her blood pressure is normal.  She is due for her pap today and routine labs. She has no other preventive screenings due at this time. She denies any pain or other concerns today.   Current Medication: Outpatient Encounter Medications as of 02/03/2021  Medication Sig   [DISCONTINUED] clindamycin (CLEOCIN T) 1 % lotion Apply topically 2 (two) times daily. (Patient not taking: Reported on 02/03/2021)   [DISCONTINUED] Clindamycin Phos-Benzoyl Perox gel Apply QAM to face (Patient not taking: Reported on 02/03/2021)   [DISCONTINUED] doxycycline (VIBRA-TABS) 100 MG tablet Take 1 tablet (100 mg total) by mouth daily. (Patient not taking: Reported on 02/03/2021)   [DISCONTINUED] drospirenone-ethinyl estradiol (YAZ) 3-0.02 MG tablet Take 1 tablet by mouth daily. (Patient not taking: Reported on 02/03/2021)   [DISCONTINUED] Sulfacetamide Sodium-Sulfur (SULFACLEANSE 8/4) 8-4 % SUSP Wash the face and chest daily with medication (Patient not taking: Reported on 02/03/2021)   No facility-administered encounter medications on file as of 02/03/2021.    Surgical History: History reviewed. No pertinent surgical history.  Medical History: Past Medical History:  Diagnosis Date   Dysplastic nevus 03/24/2015   left side above waistline, mild atypia, lateral margin involved.    Family History: Family History  Problem Relation Age of Onset   Breast cancer Paternal Grandmother    Prostate cancer Paternal Grandfather    Diabetes Paternal Grandfather     Social History   Socioeconomic  History   Marital status: Married    Spouse name: Not on file   Number of children: Not on file   Years of education: Not on file   Highest education level: Not on file  Occupational History   Not on file  Tobacco Use   Smoking status: Never   Smokeless tobacco: Never  Substance and Sexual Activity   Alcohol use: Yes    Alcohol/week: 1.0 standard drink    Types: 1 Glasses of wine per week    Comment: weekly   Drug use: Never   Sexual activity: Not on file  Other Topics Concern   Not on file  Social History Narrative   Not on file   Social Determinants of Health   Financial Resource Strain: Not on file  Food Insecurity: Not on file  Transportation Needs: Not on file  Physical Activity: Not on file  Stress: Not on file  Social Connections: Not on file  Intimate Partner Violence: Not on file      Review of Systems  Constitutional:  Negative for activity change, appetite change, chills, fatigue, fever and unexpected weight change.  HENT: Negative.  Negative for congestion, ear pain, rhinorrhea, sore throat and trouble swallowing.   Eyes: Negative.   Respiratory: Negative.  Negative for cough, chest tightness, shortness of breath and wheezing.   Cardiovascular: Negative.  Negative for chest pain.  Gastrointestinal: Negative.  Negative for abdominal pain, blood in stool, constipation, diarrhea, nausea and vomiting.  Endocrine: Negative.   Genitourinary: Negative.  Negative for difficulty urinating, dysuria, frequency, hematuria and urgency.  Musculoskeletal: Negative.  Negative for arthralgias, back pain, joint swelling, myalgias and  neck pain.  Skin: Negative.  Negative for rash and wound.  Allergic/Immunologic: Negative.  Negative for immunocompromised state.  Neurological: Negative.  Negative for dizziness, seizures, numbness and headaches.  Hematological: Negative.   Psychiatric/Behavioral: Negative.  Negative for behavioral problems, self-injury and suicidal ideas.  The patient is not nervous/anxious.    Vital Signs: BP 115/66   Pulse 75   Temp 98.3 F (36.8 C)   Resp 16   Ht _0  (1.676 m)   Wt 168 lb 12.8 oz (76.6 kg)   SpO2 99%   BMI 27.25 kg/m    Physical Exam Vitals reviewed.  Constitutional:      General: She is awake. She is not in acute distress.    Appearance: Normal appearance. She is well-developed, well-groomed and normal weight. She is not ill-appearing or diaphoretic.  HENT:     Head: Normocephalic and atraumatic.     Right Ear: Tympanic membrane, ear canal and external ear normal.     Left Ear: Tympanic membrane, ear canal and external ear normal.     Nose: Nose normal. No congestion or rhinorrhea.     Mouth/Throat:     Mouth: Mucous membranes are moist.     Pharynx: Oropharynx is clear. No oropharyngeal exudate or posterior oropharyngeal erythema.  Eyes:     General: No scleral icterus.       Right eye: No discharge.        Left eye: No discharge.     Extraocular Movements: Extraocular movements intact.     Conjunctiva/sclera: Conjunctivae normal.     Pupils: Pupils are equal, round, and reactive to light.  Neck:     Thyroid: No thyromegaly.     Vascular: No carotid bruit or JVD.     Trachea: No tracheal deviation.  Cardiovascular:     Rate and Rhythm: Normal rate and regular rhythm.     Pulses: Normal pulses.     Heart sounds: Normal heart sounds. No murmur heard.   No friction rub. No gallop.  Pulmonary:     Effort: Pulmonary effort is normal. No respiratory distress.     Breath sounds: Normal breath sounds. No stridor. No wheezing or rales.  Chest:     Chest wall: No tenderness.  Breasts:    Right: Normal. No swelling, bleeding, inverted nipple, mass, nipple discharge, skin change or tenderness.     Left: Normal. No swelling, bleeding, inverted nipple, mass, nipple discharge, skin change or tenderness.  Abdominal:     General: Bowel sounds are normal. There is no distension.     Palpations: Abdomen is  soft. There is no mass.     Tenderness: There is no abdominal tenderness. There is no guarding or rebound.     Hernia: There is no hernia in the left inguinal area or right inguinal area.  Genitourinary:    General: Normal vulva.     Exam position: Lithotomy position.     Labia:        Right: No rash, tenderness, lesion or injury.        Left: No rash, tenderness, lesion or injury.      Urethra: No prolapse, urethral swelling or urethral lesion.     Vagina: Normal. No signs of injury and foreign body. No vaginal discharge, erythema, tenderness, bleeding, lesions or prolapsed vaginal walls.     Cervix: No cervical motion tenderness, discharge, friability, lesion, erythema, cervical bleeding or eversion.     Uterus: Normal. Not deviated, not enlarged, not fixed,  not tender and no uterine prolapse.      Adnexa: Right adnexa normal and left adnexa normal.     Rectum: No tenderness, anal fissure or external hemorrhoid. Normal anal tone.  Musculoskeletal:        General: No tenderness or deformity. Normal range of motion.     Cervical back: Normal range of motion and neck supple.  Lymphadenopathy:     Cervical: No cervical adenopathy.     Upper Body:     Right upper body: No supraclavicular, axillary or pectoral adenopathy.     Left upper body: No supraclavicular, axillary or pectoral adenopathy.     Lower Body: No right inguinal adenopathy. No left inguinal adenopathy.  Skin:    General: Skin is warm and dry.     Capillary Refill: Capillary refill takes less than 2 seconds.     Coloration: Skin is not pale.     Findings: No erythema or rash.  Neurological:     Mental Status: She is alert and oriented to person, place, and time.     Cranial Nerves: No cranial nerve deficit.     Motor: No abnormal muscle tone.     Coordination: Coordination normal.     Gait: Gait normal.     Deep Tendon Reflexes: Reflexes are normal and symmetric.  Psychiatric:        Mood and Affect: Mood and affect  normal.        Behavior: Behavior normal. Behavior is cooperative.        Thought Content: Thought content normal.        Judgment: Judgment normal.       Assessment/Plan: 1. Encounter for general adult medical examination with abnormal findings Age-appropriate preventive screenings and vaccinations discussed, annual physical exam completed. Routine labs for health maintenance ordered, see below. PHM updated. Pap and breast exam due  2. Irregular menstrual bleeding Routine labs ordered, she has had irregular menstrual bleeding in the past and has a history of iron overload.  - CBC with Differential/Platelet - CMP14+EGFR - TSH + free T4  3. Vitamin D deficiency Routine lab ordered - Vitamin D (25 hydroxy)  4. Mixed hyperlipidemia Routine labs ordered - Lipid Profile - CMP14+EGFR - TSH + free T4  5. Iron overload Routine labs ordered - CBC with Differential/Platelet - Iron, TIBC and Ferritin Panel  6. B12 deficiency Routine labs ordered - CBC with Differential/Platelet - B12 and Folate Panel  7. Dysuria Routine urinalysis done - UA/M w/rflx Culture, Routine  8. Encounter for gynecological examination (general) (routine) without abnormal findings Normal pelvic exam, bimanual exam and breast exam.   9. Encounter for screening for malignant neoplasm of cervix Routine pap done - IGP, Aptima HPV  10. Encounter for screening for infections with predominantly sexual mode of transmission Routine vaginal swab for STDs - NuSwab Vaginitis Plus (VG+)      General Counseling: Izabel verbalizes understanding of the findings of todays visit and agrees with plan of treatment. I have discussed any further diagnostic evaluation that may be needed or ordered today. We also reviewed her medications today. she has been encouraged to call the office with any questions or concerns that should arise related to todays visit.    Orders Placed This Encounter  Procedures    Microscopic Examination   UA/M w/rflx Culture, Routine   NuSwab Vaginitis Plus (VG+)   CBC with Differential/Platelet   Lipid Profile   CMP14+EGFR   Vitamin D (25 hydroxy)   TSH +  free T4   Iron, TIBC and Ferritin Panel   B12 and Folate Panel    No orders of the defined types were placed in this encounter.   Return in about 1 year (around 02/03/2022) for CPE, [place] PCP.   Total time spent:30 Minutes Time spent includes review of chart, medications, test results, and follow up plan with the patient.   [place] Controlled Substance Database was reviewed by me.  This patient was seen by Jonetta Osgood, FNP-C in collaboration with Dr. Clayborn Bigness as a part of collaborative care agreement.  Ashtyn Meland R. Valetta Fuller, MSN, FNP-C Internal medicine

## 2021-02-04 ENCOUNTER — Telehealth: Payer: Self-pay

## 2021-02-04 DIAGNOSIS — E538 Deficiency of other specified B group vitamins: Secondary | ICD-10-CM | POA: Diagnosis not present

## 2021-02-04 DIAGNOSIS — E782 Mixed hyperlipidemia: Secondary | ICD-10-CM | POA: Diagnosis not present

## 2021-02-04 DIAGNOSIS — N926 Irregular menstruation, unspecified: Secondary | ICD-10-CM | POA: Diagnosis not present

## 2021-02-04 DIAGNOSIS — E559 Vitamin D deficiency, unspecified: Secondary | ICD-10-CM | POA: Diagnosis not present

## 2021-02-04 LAB — UA/M W/RFLX CULTURE, ROUTINE
Bilirubin, UA: NEGATIVE
Glucose, UA: NEGATIVE
Leukocytes,UA: NEGATIVE
Nitrite, UA: NEGATIVE
RBC, UA: NEGATIVE
Specific Gravity, UA: 1.025 (ref 1.005–1.030)
Urobilinogen, Ur: 0.2 mg/dL (ref 0.2–1.0)
pH, UA: 6 (ref 5.0–7.5)

## 2021-02-04 LAB — MICROSCOPIC EXAMINATION
Bacteria, UA: NONE SEEN
Casts: NONE SEEN /lpf
WBC, UA: NONE SEEN /hpf (ref 0–5)

## 2021-02-05 LAB — CBC WITH DIFFERENTIAL/PLATELET
Basophils Absolute: 0.1 10*3/uL (ref 0.0–0.2)
Basos: 1 %
EOS (ABSOLUTE): 0.1 10*3/uL (ref 0.0–0.4)
Eos: 1 %
Hematocrit: 36.9 % (ref 34.0–46.6)
Hemoglobin: 11.8 g/dL (ref 11.1–15.9)
Immature Grans (Abs): 0 10*3/uL (ref 0.0–0.1)
Immature Granulocytes: 0 %
Lymphocytes Absolute: 2.6 10*3/uL (ref 0.7–3.1)
Lymphs: 34 %
MCH: 25.3 pg — ABNORMAL LOW (ref 26.6–33.0)
MCHC: 32 g/dL (ref 31.5–35.7)
MCV: 79 fL (ref 79–97)
Monocytes Absolute: 0.9 10*3/uL (ref 0.1–0.9)
Monocytes: 12 %
Neutrophils Absolute: 4 10*3/uL (ref 1.4–7.0)
Neutrophils: 52 %
Platelets: 191 10*3/uL (ref 150–450)
RBC: 4.67 x10E6/uL (ref 3.77–5.28)
RDW: 15.6 % — ABNORMAL HIGH (ref 11.7–15.4)
WBC: 7.7 10*3/uL (ref 3.4–10.8)

## 2021-02-05 LAB — CMP14+EGFR
ALT: 30 IU/L (ref 0–32)
AST: 32 IU/L (ref 0–40)
Albumin/Globulin Ratio: 2 (ref 1.2–2.2)
Albumin: 4.5 g/dL (ref 3.9–5.0)
Alkaline Phosphatase: 66 IU/L (ref 44–121)
BUN/Creatinine Ratio: 11 (ref 9–23)
BUN: 9 mg/dL (ref 6–20)
Bilirubin Total: 0.4 mg/dL (ref 0.0–1.2)
CO2: 23 mmol/L (ref 20–29)
Calcium: 9.2 mg/dL (ref 8.7–10.2)
Chloride: 103 mmol/L (ref 96–106)
Creatinine, Ser: 0.8 mg/dL (ref 0.57–1.00)
Globulin, Total: 2.3 g/dL (ref 1.5–4.5)
Glucose: 81 mg/dL (ref 70–99)
Potassium: 4.8 mmol/L (ref 3.5–5.2)
Sodium: 139 mmol/L (ref 134–144)
Total Protein: 6.8 g/dL (ref 6.0–8.5)
eGFR: 103 mL/min/{1.73_m2} (ref >59)

## 2021-02-05 LAB — B12 AND FOLATE PANEL
Folate: 17 ng/mL (ref >3.0)
Vitamin B-12: 310 pg/mL (ref 232–1245)

## 2021-02-05 LAB — LIPID PANEL
Chol/HDL Ratio: 2.5 ratio (ref 0.0–4.4)
Cholesterol, Total: 185 mg/dL (ref 100–199)
HDL: 74 mg/dL (ref >39)
LDL Chol Calc (NIH): 102 mg/dL — ABNORMAL HIGH (ref 0–99)
Triglycerides: 45 mg/dL (ref 0–149)
VLDL Cholesterol Cal: 9 mg/dL (ref 5–40)

## 2021-02-05 LAB — VITAMIN D 25 HYDROXY (VIT D DEFICIENCY, FRACTURES): Vit D, 25-Hydroxy: 33.5 ng/mL (ref 30.0–100.0)

## 2021-02-05 LAB — IRON,TIBC AND FERRITIN PANEL
Ferritin: 9 ng/mL — ABNORMAL LOW (ref 15–150)
Iron Saturation: 13 % — ABNORMAL LOW (ref 15–55)
Iron: 54 ug/dL (ref 27–159)
Total Iron Binding Capacity: 416 ug/dL (ref 250–450)
UIBC: 362 ug/dL (ref 131–425)

## 2021-02-05 LAB — NUSWAB VAGINITIS PLUS (VG+)
Candida albicans, NAA: NEGATIVE
Candida glabrata, NAA: NEGATIVE
Chlamydia trachomatis, NAA: NEGATIVE
Neisseria gonorrhoeae, NAA: NEGATIVE
Trich vag by NAA: NEGATIVE

## 2021-02-05 LAB — TSH+FREE T4
Free T4: 1.08 ng/dL (ref 0.82–1.77)
TSH: 1.62 u[IU]/mL (ref 0.450–4.500)

## 2021-02-07 ENCOUNTER — Other Ambulatory Visit: Payer: BC Managed Care – PPO | Admitting: Physician Assistant

## 2021-02-07 NOTE — Telephone Encounter (Signed)
LMOM explaining questions pt had n her UA results.  Advised for pt to call back if any questions

## 2021-02-08 LAB — IGP, APTIMA HPV: HPV Aptima: NEGATIVE

## 2021-03-01 ENCOUNTER — Encounter: Payer: Self-pay | Admitting: Nurse Practitioner

## 2021-03-18 DIAGNOSIS — Z3403 Encounter for supervision of normal first pregnancy, third trimester: Principal | ICD-10-CM | POA: Insufficient documentation

## 2021-03-18 DIAGNOSIS — N912 Amenorrhea, unspecified: Secondary | ICD-10-CM | POA: Diagnosis not present

## 2021-03-30 ENCOUNTER — Ambulatory Visit (INDEPENDENT_AMBULATORY_CARE_PROVIDER_SITE_OTHER): Payer: BC Managed Care – PPO | Admitting: Obstetrics and Gynecology

## 2021-03-30 ENCOUNTER — Other Ambulatory Visit: Payer: Self-pay

## 2021-03-30 ENCOUNTER — Encounter: Payer: Self-pay | Admitting: Obstetrics and Gynecology

## 2021-03-30 VITALS — BP 102/69 | HR 76 | Ht 66.0 in | Wt 172.2 lb

## 2021-03-30 DIAGNOSIS — Z7689 Persons encountering health services in other specified circumstances: Secondary | ICD-10-CM | POA: Diagnosis not present

## 2021-03-30 LAB — POCT URINE PREGNANCY: Preg Test, Ur: POSITIVE — AB

## 2021-03-30 NOTE — Progress Notes (Signed)
Patient presents for pregnancy confirmation. UPT is positive. Patient reports no questions or concerns at this time.

## 2021-03-30 NOTE — Progress Notes (Signed)
HPI:      [name] is a 26 y.o. G1P0 who LMP was Patient's last menstrual period was 01/14/2021 (exact date).  Subjective:   She presents today to establish/begin prenatal care.  She is approximately 10 weeks estimated gestational age by early ultrasound.  She attempted pregnancy for 1 month and immediately became pregnant.  She is excited about being pregnant.  She is currently taking prenatal vitamins.  She is experiencing some nausea but no vomiting. She is vaccinated against COVID    Hx: The following portions of the patient's history were reviewed and updated as appropriate:             She  has a past medical history of Dysplastic nevus (03/24/2015). She does not have any pertinent problems on file. She  has a past surgical history that includes Wisdom tooth extraction and Breast biopsy (Left). Her family history includes Breast cancer in her paternal grandmother; Diabetes in her paternal grandfather; Prostate cancer in her paternal grandfather. She  reports that she has never smoked. She has never used smokeless tobacco. She reports that she does not currently use alcohol after a past usage of about 1.0 standard drink per week. She reports that she does not use drugs. She has a current medication list which includes the following prescription(s): multivitamin-prenatal. She has No Known Allergies.       Review of Systems:  Review of Systems  Constitutional: Denied constitutional symptoms, night sweats, recent illness, fatigue, fever, insomnia and weight loss.  Eyes: Denied eye symptoms, eye pain, photophobia, vision change and visual disturbance.  Ears/Nose/Throat/Neck: Denied ear, nose, throat or neck symptoms, hearing loss, nasal discharge, sinus congestion and sore throat.  Cardiovascular: Denied cardiovascular symptoms, arrhythmia, chest pain/pressure, edema, exercise intolerance, orthopnea and palpitations.  Respiratory: Denied pulmonary symptoms, asthma, pleuritic  pain, productive sputum, cough, dyspnea and wheezing.  Gastrointestinal: Denied, gastro-esophageal reflux, melena, nausea and vomiting.  Genitourinary: Denied genitourinary symptoms including symptomatic vaginal discharge, pelvic relaxation issues, and urinary complaints.  Musculoskeletal: Denied musculoskeletal symptoms, stiffness, swelling, muscle weakness and myalgia.  Dermatologic: Denied dermatology symptoms, rash and scar.  Neurologic: Denied neurology symptoms, dizziness, headache, neck pain and syncope.  Psychiatric: Denied psychiatric symptoms, anxiety and depression.  Endocrine: Denied endocrine symptoms including hot flashes and night sweats.   Meds:   Current Outpatient Medications on File Prior to Visit  Medication Sig Dispense Refill   Prenatal Vit-Fe Fumarate-FA (MULTIVITAMIN-PRENATAL) 27-0.8 MG TABS tablet Take 1 tablet by mouth daily at 12 noon.     No current facility-administered medications on file prior to visit.      Objective:     Vitals:   03/30/21 0959  BP: 102/69  Pulse: 76   Filed Weights   03/30/21 0959  Weight: 172 lb 3.2 oz (78.1 kg)                        Assessment:    G1P0 Patient Active Problem List   Diagnosis Date Noted   Folliculitis 04/18/3233   Allergic reaction to food 02/28/2020   Encounter for screening mammogram for malignant neoplasm of breast 02/28/2020   Mass of upper inner quadrant of left breast 01/24/2019   Encounter for prescription of oral contraceptives 01/24/2019   Vitamin D deficiency 01/21/2018   Screening for malignant neoplasm of cervix 01/21/2018   Flu vaccine need 01/21/2018   Dysuria 01/21/2018   Irregular menstrual bleeding 10/16/2017   Encounter for general adult medical examination  with abnormal findings 10/16/2017     1. Encounter to establish care     No problems at this time-approximately 10 weeks estimated gestational age   Plan:            Prenatal Plan 1.  The patient was given prenatal  literature. 2.  She was continued on prenatal vitamins. 3.  A prenatal lab panel to be drawn at nurse visit. 4.  We have discussed [facility] in the midwife versus MD track. 5.  A nurse visit was scheduled. 6.  Genetic testing and testing for other inheritable conditions discussed in detail. She will decide in the future whether to have these labs performed. 7.  A general overview of pregnancy testing, visit schedule, ultrasound schedule, and prenatal care was discussed. 8.  COVID and its risks associated with pregnancy, prevention by limiting exposure and use of masks, as well as the risks and benefits of vaccination during pregnancy were discussed in detail.  [facility] policy regarding office and hospital visitation and testing was explained. 9.  Benefits of breast-feeding discussed in detail including both maternal and infant benefits. Ready Set Baby website discussed.  Orders Orders Placed This Encounter  Procedures   POCT urine pregnancy    No orders of the defined types were placed in this encounter.     F/U  Return in about 3 weeks (around 04/20/2021). I spent 31 minutes involved in the care of this patient preparing to see the patient by obtaining and reviewing her medical history (including labs, imaging tests and prior procedures), documenting clinical information in the electronic health record (EHR), counseling and coordinating care plans, writing and sending prescriptions, ordering tests or procedures and in direct communicating with the patient and medical staff discussing pertinent items from her history and physical exam.  Finis Bud, M.D. 03/30/2021 10:48 AM

## 2021-04-21 DIAGNOSIS — Z131 Encounter for screening for diabetes mellitus: Secondary | ICD-10-CM | POA: Diagnosis not present

## 2021-04-21 DIAGNOSIS — Z114 Encounter for screening for human immunodeficiency virus [HIV]: Secondary | ICD-10-CM | POA: Diagnosis not present

## 2021-04-21 DIAGNOSIS — Z3402 Encounter for supervision of normal first pregnancy, second trimester: Secondary | ICD-10-CM | POA: Diagnosis not present

## 2021-04-21 DIAGNOSIS — D509 Iron deficiency anemia, unspecified: Secondary | ICD-10-CM | POA: Diagnosis not present

## 2021-04-21 DIAGNOSIS — Z113 Encounter for screening for infections with a predominantly sexual mode of transmission: Secondary | ICD-10-CM | POA: Diagnosis not present

## 2021-04-21 LAB — OB RESULTS CONSOLE VARICELLA ZOSTER ANTIBODY, IGG: Varicella: IMMUNE

## 2021-04-21 LAB — OB RESULTS CONSOLE RUBELLA ANTIBODY, IGM: Rubella: IMMUNE

## 2021-04-25 ENCOUNTER — Encounter: Payer: BC Managed Care – PPO | Admitting: Obstetrics and Gynecology

## 2021-05-03 ENCOUNTER — Ambulatory Visit: Payer: BC Managed Care – PPO | Admitting: Dermatology

## 2021-05-10 ENCOUNTER — Other Ambulatory Visit: Payer: Self-pay

## 2021-05-10 DIAGNOSIS — E611 Iron deficiency: Secondary | ICD-10-CM

## 2021-05-10 DIAGNOSIS — D649 Anemia, unspecified: Secondary | ICD-10-CM

## 2021-05-10 DIAGNOSIS — K909 Intestinal malabsorption, unspecified: Secondary | ICD-10-CM

## 2021-05-10 DIAGNOSIS — E538 Deficiency of other specified B group vitamins: Secondary | ICD-10-CM

## 2021-05-10 NOTE — Progress Notes (Signed)
Spoke to pt, informed her of repeat blood work, pt stated she is now pregnant and is getting blood work done all the time. Spoke to [name], she said never mind on the blood work for now.

## 2021-05-10 NOTE — Progress Notes (Signed)
Please have patient go for repeat testing for CBC, B12 and folate level, iron, TIBC, iron saturation, and ferritin level. Please put lab orders in and let patient know. If still abnormal, will discuss referral to hematology

## 2021-06-02 DIAGNOSIS — M5489 Other dorsalgia: Secondary | ICD-10-CM | POA: Diagnosis not present

## 2021-06-03 DIAGNOSIS — Z3402 Encounter for supervision of normal first pregnancy, second trimester: Secondary | ICD-10-CM | POA: Diagnosis not present

## 2021-08-05 DIAGNOSIS — Z23 Encounter for immunization: Secondary | ICD-10-CM | POA: Diagnosis not present

## 2021-08-05 DIAGNOSIS — Z3402 Encounter for supervision of normal first pregnancy, second trimester: Secondary | ICD-10-CM | POA: Diagnosis not present

## 2021-09-01 DIAGNOSIS — O26843 Uterine size-date discrepancy, third trimester: Secondary | ICD-10-CM | POA: Diagnosis not present

## 2021-09-19 DIAGNOSIS — O3663X0 Maternal care for excessive fetal growth, third trimester, not applicable or unspecified: Secondary | ICD-10-CM | POA: Diagnosis not present

## 2021-09-22 ENCOUNTER — Ambulatory Visit: Payer: BC Managed Care – PPO | Admitting: Dermatology

## 2021-09-23 DIAGNOSIS — Z113 Encounter for screening for infections with a predominantly sexual mode of transmission: Secondary | ICD-10-CM | POA: Diagnosis not present

## 2021-09-23 DIAGNOSIS — Z3403 Encounter for supervision of normal first pregnancy, third trimester: Secondary | ICD-10-CM | POA: Diagnosis not present

## 2021-09-23 DIAGNOSIS — E538 Deficiency of other specified B group vitamins: Secondary | ICD-10-CM | POA: Diagnosis not present

## 2021-09-23 DIAGNOSIS — Z114 Encounter for screening for human immunodeficiency virus [HIV]: Secondary | ICD-10-CM | POA: Diagnosis not present

## 2021-09-23 LAB — OB RESULTS CONSOLE GC/CHLAMYDIA
Chlamydia: NEGATIVE
Neisseria Gonorrhea: NEGATIVE

## 2021-09-23 LAB — OB RESULTS CONSOLE GBS: GBS: NEGATIVE

## 2021-09-23 LAB — OB RESULTS CONSOLE HIV ANTIBODY (ROUTINE TESTING): HIV: NONREACTIVE

## 2021-09-23 LAB — OB RESULTS CONSOLE HEPATITIS B SURFACE ANTIGEN: Hepatitis B Surface Ag: NEGATIVE

## 2021-09-23 LAB — OB RESULTS CONSOLE RPR: RPR: NONREACTIVE

## 2021-09-27 ENCOUNTER — Observation Stay: Payer: BC Managed Care – PPO

## 2021-09-27 ENCOUNTER — Encounter: Payer: Self-pay | Admitting: Obstetrics and Gynecology

## 2021-09-27 ENCOUNTER — Observation Stay
Admission: EM | Admit: 2021-09-27 | Discharge: 2021-09-27 | Disposition: A | Discharge status: Home / Self Care | Payer: BC Managed Care – PPO | Attending: Obstetrics and Gynecology | Admitting: Obstetrics and Gynecology

## 2021-09-27 DIAGNOSIS — O368130 Decreased fetal movements, third trimester, not applicable or unspecified: Principal | ICD-10-CM | POA: Diagnosis present

## 2021-09-27 DIAGNOSIS — Z79899 Other long term (current) drug therapy: Secondary | ICD-10-CM | POA: Insufficient documentation

## 2021-09-27 DIAGNOSIS — Z8616 Personal history of COVID-19: Secondary | ICD-10-CM | POA: Insufficient documentation

## 2021-09-27 DIAGNOSIS — Z3A36 36 weeks gestation of pregnancy: Secondary | ICD-10-CM | POA: Insufficient documentation

## 2021-09-27 DIAGNOSIS — Z3A38 38 weeks gestation of pregnancy: Secondary | ICD-10-CM | POA: Diagnosis not present

## 2021-09-27 DIAGNOSIS — O321XX0 Maternal care for breech presentation, not applicable or unspecified: Secondary | ICD-10-CM | POA: Diagnosis not present

## 2021-09-27 NOTE — Progress Notes (Signed)
Patient to ultrasound

## 2021-09-27 NOTE — Discharge Summary (Signed)
Kim Shannon is a 26 y.o. female. She is at 75w4dgestation. Patient's last menstrual period was 01/14/2021 (exact date). Estimated Date of Delivery: 10/21/21  [facility]  Current pregnancy complicated by:  Iron deficiency anemia Unstable lie: breech at 396VEL cephalic at [id number] COVID infx 1st trimester  Chief complaint: decreased fetal movement today.    S: Resting comfortably. no CTX, no VB.no LOF,  Active fetal movement noted now.  Denies: HA, visual changes, SOB, or RUQ/epigastric pain  Maternal Medical History:   Past Medical History:  Diagnosis Date   Dysplastic nevus 03/24/2015   left side above waistline, mild atypia, lateral margin involved.    Past Surgical History:  Procedure Laterality Date   BREAST BIOPSY Left    WISDOM TOOTH EXTRACTION      No Known Allergies  Prior to Admission medications   Medication Sig Start Date End Date Taking? Authorizing Provider  vitamin B-12 (CYANOCOBALAMIN) 500 MCG tablet Take 1,000 mcg by mouth daily.   Yes [provider]  Prenatal Vit-Fe Fumarate-FA (MULTIVITAMIN-PRENATAL) 27-0.8 MG TABS tablet Take 1 tablet by mouth daily at 12 noon.    [provider]      Social History: She  reports that she has never smoked. She has never used smokeless tobacco. She reports that she does not currently use alcohol after a past usage of about 1.0 standard drink of alcohol per week. She reports that she does not use drugs.  Family History: family history includes Breast cancer in her paternal grandmother; Diabetes in her paternal grandfather; Prostate cancer in her paternal grandfather.   Review of Systems: A full review of systems was performed and negative except as noted in the HPI.     O:  BP 126/80 (BP Location: Left Arm)   Pulse 75   Temp 98.7 F (37.1 C) (Oral)   Resp 15   Ht '5\' 6"'$  (1.676 m)   Wt 88.9 kg   LMP 01/14/2021 (Exact Date)   BMI 31.64 kg/m   No results found for this or any previous visit (from the past 470hour(s)).   Constitutional: NAD, AAOx3  HE/ENT: extraocular movements grossly intact, moist mucous membranes CV: RRR PULM: nl respiratory effort, CTABL     Abd: gravid, non-tender, non-distended, soft      Ext: Non-tender, Nonedematous   Psych: mood appropriate, speech normal Pelvic: deferred  Fetal  monitoring: Cat I Appropriate for GA, reactive Baseline: 130bpm Variability: moderate Accelerations: present x >2 Decelerations absent Time 239ms  [employer] AFI: 10.5 cm Presentation: Breech  A/P: [age].o. 3629w4dre for antenatal surveillance for decreased fetal movement  Principle Diagnosis:  36wks, DFM  Preterm labor: not present.  Fetal Wellbeing: Reassuring Cat 1 tracing. Reactive NST  D/c home stable, precautions reviewed, follow-up as scheduled.    RebFrancetta FoundNM 09/27/2021  6:03 PM

## 2021-09-27 NOTE — OB Triage Note (Signed)
Discharge instructions, labor precautions, and follow-up care reviewed with patient and significant other. All questions answered. Patient verbalized understanding. Discharged ambulatory off unit.

## 2021-09-27 NOTE — OB Triage Note (Signed)
Patient is a G1P0 at 6w4dwho presents to unit c/o decreased fetal movement. Patient states she has not felt her move as much today and baby is usually super active. Denies ctx, LOF, and vaginal bleeding. External monitors applied and assessing. InitiaL fht 130.

## 2021-09-29 DIAGNOSIS — O26849 Uterine size-date discrepancy, unspecified trimester: Secondary | ICD-10-CM | POA: Diagnosis not present

## 2021-09-29 DIAGNOSIS — O169 Unspecified maternal hypertension, unspecified trimester: Secondary | ICD-10-CM | POA: Diagnosis not present

## 2021-09-29 NOTE — H&P (Signed)
Kim Shannon is a 26 y.o. female presenting for primary LTCS for breech . EDC at 10/20/21 EGA 39+1 weeks   .G1P0 at 84w2dPatient's last menstrual period was 01/13/2021. consistent with  with ultrasound @ 871w5dEstimated Date of Delivery: 10/20/2021 Sex of baby and name:  Baby Girl, Surprise name FOB:    GeBerneta SagesFactors complicating this pregnancy  Breech at 33wks - Resolved on 09/23/2021 Discussed moxibustion and spinning babies Schedule with MD to discuss ECV vs CS 09/23/2021 - vertex by [place] deficiency anemia - chronic  Iron study labs: NOB: WNL 28 weeks: Hgb 12.3, ferritin 9 36 weeks: hgb 13.2 Covid-19 infection in 1st trimester 04/04/2021 - pos for Covid-19 US[place] growth 34-36 weeks Weekly NST's at 36 weeks  Low vitamin B12 NOB - 213 Started on supplements '[x]'$  recheck at 28 weeks: 211 '[x]'$  recheck at 36 weeks: 270   Screening results and needs: NOB:  Medicaid Questionnaire: NA Depression Score: MBT:  A+ Ab screen: Negative  HIV:  Neg Hep B/RPR: Neg/NR Pap: 1027/2022 NILM, hrHPV neg G/C: neg/neg  Rubella:  Immune  VZV: Immune TSH: 1.620 HgA1C: 5.4   Hep C: Neg Aneuploidy:  First trimester:  MaternitT21:  Negative  Second trimester (AFP/tetra): Negative 28 weeks:  Review Medicaid Questionnaire: N/A '[]'$  Cassandra  Depression Score: 1 Blood consent: signed 08/05/21 Hgb:   Platelets:    Glucola:   Rhogam: n/A 36 weeks:  GBS:  Neg G/C: Neg/Neg  Hgb: 13.2 Platelets:  149  HIV/RPR:  Neg/NR  Last [place] 03/18/2021: single, viable IUP seen, FHT 170bpm; cervical length 5.26cm; fibroid seen right mid 1.1cm; right ovary WNL; left corpus luteum cyst = 1.9cm  06/03/21: Single, viable IUP, S=20.0 wks FHT=146bpm Cx closed=4.09cm Placenta=ant Position=vertex Fibroid not seen  09/02/21: EFW= 5lb6oz(2452g)=81%  FHR=138bpm AFI= 16.01cm @ 58% Placenta=anterior Position=breech  Immunization:   Flu in season -declined Tdap at 27-36 weeks - given 08/05/21 ALM Covid-19 -  Contraception  Plan:  OB History     Gravida  1   Para      Term      Preterm      AB      Living         SAB      IAB      Ectopic      Multiple      Live Births             Past Medical History:  Diagnosis Date   Dysplastic nevus 03/24/2015   left side above waistline, mild atypia, lateral margin involved.   Past Surgical History:  Procedure Laterality Date   BREAST BIOPSY Left    WISDOM TOOTH EXTRACTION     Family History: family history includes Breast cancer in her paternal grandmother; Diabetes in her paternal grandfather; Prostate cancer in her paternal grandfather. Social History:  reports that she has never smoked. She has never used smokeless tobacco. She reports that she does not currently use alcohol after a past usage of about 1.0 standard drink of alcohol per week. She reports that she does not use drugs.        Review of Systems Review of Systems: A full review of systems was performed and negative except as noted in the HPI.   Eyes: no vision change  Ears: left ear pain  Oropharynx: no sore throat  Pulmonary . No shortness of breath , no hemoptysis Cardiovascular: no chest pain , no irregular heart beat  Gastrointestinal:no blood in stool . No diarrhea, no constipation Uro gynecologic: no dysuria , no pelvic pain Neurologic : no seizure , no migraines    Musculoskeletal: no muscular weakness   Last menstrual period 01/14/2021. Exam6/22/23  BP 119/77 Physical Exam   LUNGS : Cta , CV RRR  Abd: gravid  Prenatal labs: ABO, Rh:  A+ Antibody:  neg Rubella:  IMM, VZ ;IMM RPR:   NR HBsAg:   neg HIV:   neg  GBS:   neg   Assessment/Plan: Breech presentation   Elective Primary LTCS 10/14/21  Risks discussed see [facility] notes    Gwen Her Undray Allman 09/29/2021, 3:47 PM

## 2021-10-05 DIAGNOSIS — O98513 Other viral diseases complicating pregnancy, third trimester: Secondary | ICD-10-CM | POA: Diagnosis not present

## 2021-10-05 DIAGNOSIS — O169 Unspecified maternal hypertension, unspecified trimester: Secondary | ICD-10-CM | POA: Diagnosis not present

## 2021-10-05 DIAGNOSIS — U071 COVID-19: Secondary | ICD-10-CM | POA: Diagnosis not present

## 2021-10-05 DIAGNOSIS — O0993 Supervision of high risk pregnancy, unspecified, third trimester: Secondary | ICD-10-CM | POA: Diagnosis not present

## 2021-10-07 ENCOUNTER — Inpatient Hospital Stay: Admission: RE | Admit: 2021-10-07 | Payer: BC Managed Care – PPO | Source: Ambulatory Visit

## 2021-10-12 ENCOUNTER — Ambulatory Visit: Payer: BC Managed Care – PPO | Admitting: Dermatology

## 2021-10-12 ENCOUNTER — Other Ambulatory Visit: Payer: Self-pay | Admitting: Obstetrics and Gynecology

## 2021-10-12 DIAGNOSIS — Z8616 Personal history of COVID-19: Secondary | ICD-10-CM | POA: Diagnosis not present

## 2021-10-12 DIAGNOSIS — O99891 Other specified diseases and conditions complicating pregnancy: Secondary | ICD-10-CM | POA: Diagnosis not present

## 2021-10-12 NOTE — Progress Notes (Unsigned)
Induction oders

## 2021-10-14 ENCOUNTER — Encounter: Admission: RE | Payer: Self-pay | Source: Home / Self Care

## 2021-10-14 ENCOUNTER — Inpatient Hospital Stay
Admission: RE | Admit: 2021-10-14 | Payer: BC Managed Care – PPO | Source: Home / Self Care | Admitting: Obstetrics and Gynecology

## 2021-10-14 SURGERY — Surgical Case
Anesthesia: Spinal

## 2021-10-18 ENCOUNTER — Inpatient Hospital Stay
Admission: EM | Admit: 2021-10-18 | Discharge: 2021-10-20 | DRG: 787 | Disposition: A | Discharge status: Home / Self Care | Payer: BC Managed Care – PPO | Attending: Obstetrics | Admitting: Obstetrics

## 2021-10-18 ENCOUNTER — Inpatient Hospital Stay: Payer: BC Managed Care – PPO | Admitting: Anesthesiology

## 2021-10-18 ENCOUNTER — Encounter
Admission: EM | Disposition: A | Discharge status: Home / Self Care | Payer: Self-pay | Source: Home / Self Care | Attending: Obstetrics

## 2021-10-18 ENCOUNTER — Other Ambulatory Visit: Payer: Self-pay

## 2021-10-18 ENCOUNTER — Encounter: Payer: Self-pay | Admitting: Obstetrics and Gynecology

## 2021-10-18 DIAGNOSIS — Z3A39 39 weeks gestation of pregnancy: Secondary | ICD-10-CM | POA: Diagnosis not present

## 2021-10-18 DIAGNOSIS — Z2882 Immunization not carried out because of caregiver refusal: Secondary | ICD-10-CM | POA: Diagnosis not present

## 2021-10-18 DIAGNOSIS — Z3490 Encounter for supervision of normal pregnancy, unspecified, unspecified trimester: Secondary | ICD-10-CM | POA: Diagnosis present

## 2021-10-18 DIAGNOSIS — Z3403 Encounter for supervision of normal first pregnancy, third trimester: Principal | ICD-10-CM

## 2021-10-18 DIAGNOSIS — O26893 Other specified pregnancy related conditions, third trimester: Principal | ICD-10-CM | POA: Diagnosis present

## 2021-10-18 DIAGNOSIS — D62 Acute posthemorrhagic anemia: Secondary | ICD-10-CM | POA: Diagnosis not present

## 2021-10-18 DIAGNOSIS — O9081 Anemia of the puerperium: Secondary | ICD-10-CM | POA: Diagnosis not present

## 2021-10-18 DIAGNOSIS — Z8616 Personal history of COVID-19: Secondary | ICD-10-CM

## 2021-10-18 DIAGNOSIS — O368130 Decreased fetal movements, third trimester, not applicable or unspecified: Secondary | ICD-10-CM | POA: Diagnosis not present

## 2021-10-18 DIAGNOSIS — Z1231 Encounter for screening mammogram for malignant neoplasm of breast: Secondary | ICD-10-CM

## 2021-10-18 HISTORY — DX: Anemia, unspecified: D64.9

## 2021-10-18 LAB — CBC WITH DIFFERENTIAL/PLATELET
Abs Immature Granulocytes: 0.08 10*3/uL — ABNORMAL HIGH (ref 0.00–0.07)
Basophils Absolute: 0.1 10*3/uL (ref 0.0–0.1)
Basophils Relative: 1 %
Eosinophils Absolute: 0.1 10*3/uL (ref 0.0–0.5)
Eosinophils Relative: 1 %
HCT: 40.1 % (ref 36.0–46.0)
Hemoglobin: 13.3 g/dL (ref 12.0–15.0)
Immature Granulocytes: 1 %
Lymphocytes Relative: 22 %
Lymphs Abs: 3.1 10*3/uL (ref 0.7–4.0)
MCH: 28.5 pg (ref 26.0–34.0)
MCHC: 33.2 g/dL (ref 30.0–36.0)
MCV: 85.9 fL (ref 80.0–100.0)
Monocytes Absolute: 1.1 10*3/uL — ABNORMAL HIGH (ref 0.1–1.0)
Monocytes Relative: 8 %
Neutro Abs: 9.8 10*3/uL — ABNORMAL HIGH (ref 1.7–7.7)
Neutrophils Relative %: 67 %
Platelets: 153 10*3/uL (ref 150–400)
RBC: 4.67 MIL/uL (ref 3.87–5.11)
RDW: 13 % (ref 11.5–15.5)
WBC: 14.3 10*3/uL — ABNORMAL HIGH (ref 4.0–10.5)
nRBC: 0 % (ref 0.0–0.2)

## 2021-10-18 LAB — ABO/RH: ABO/RH(D): A POS

## 2021-10-18 LAB — TYPE AND SCREEN
ABO/RH(D): A POS
Antibody Screen: NEGATIVE

## 2021-10-18 LAB — RPR: RPR Ser Ql: NONREACTIVE

## 2021-10-18 SURGERY — Surgical Case
Anesthesia: Epidural

## 2021-10-18 MED ORDER — LIDOCAINE HCL (PF) 1 % IJ SOLN
INTRAMUSCULAR | Status: DC | PRN
  Administered 2021-10-18: 3 mL via SUBCUTANEOUS

## 2021-10-18 MED ORDER — EPHEDRINE 5 MG/ML INJ: 10.0000 mg | INTRAVENOUS | Status: DC | PRN

## 2021-10-18 MED ORDER — TERBUTALINE SULFATE 1 MG/ML IJ SOLN: 0.2500 mg | Freq: Once | INTRAMUSCULAR | Status: DC | PRN

## 2021-10-18 MED ORDER — CEFAZOLIN SODIUM-DEXTROSE 2-4 GM/100ML-% IV SOLN
2.0000 g | INTRAVENOUS | Status: AC
Start: 2021-10-18 — End: 2021-10-18
  Administered 2021-10-18: 2 g via INTRAVENOUS

## 2021-10-18 MED ORDER — PHENYLEPHRINE 80 MCG/ML (10ML) SYRINGE FOR IV PUSH (FOR BLOOD PRESSURE SUPPORT)
80.0000 ug | PREFILLED_SYRINGE | INTRAVENOUS | Status: DC | PRN
Start: 2021-10-18 — End: 2021-10-19

## 2021-10-18 MED ORDER — ACETAMINOPHEN 325 MG PO TABS: 650.0000 mg | ORAL_TABLET | ORAL | Status: DC | PRN

## 2021-10-18 MED ORDER — LIDOCAINE HCL (PF) 1 % IJ SOLN: 30.0000 mL | INTRAMUSCULAR | Status: DC | PRN

## 2021-10-18 MED ORDER — SOD CITRATE-CITRIC ACID 500-334 MG/5ML PO SOLN
30.0000 mL | ORAL | Status: AC
  Administered 2021-10-18: 30 mL via ORAL

## 2021-10-18 MED ORDER — LIDOCAINE HCL (PF) 2 % IJ SOLN
INTRAMUSCULAR | Status: AC
  Filled 2021-10-18: qty 40

## 2021-10-18 MED ORDER — OXYTOCIN-SODIUM CHLORIDE 30-0.9 UT/500ML-% IV SOLN
INTRAVENOUS | Status: DC | PRN
  Administered 2021-10-18: 1 mL via INTRAVENOUS
  Administered 2021-10-18: 299 mL via INTRAVENOUS

## 2021-10-18 MED ORDER — LIDOCAINE-EPINEPHRINE (PF) 1.5 %-1:200000 IJ SOLN
INTRAMUSCULAR | Status: DC | PRN
  Administered 2021-10-18: 3 mL via EPIDURAL

## 2021-10-18 MED ORDER — LIDOCAINE HCL (PF) 2 % IJ SOLN
INTRAMUSCULAR | Status: DC | PRN
  Administered 2021-10-18: 60 mg via EPIDURAL
  Administered 2021-10-18: 80 mg via EPIDURAL
  Administered 2021-10-18 (×2): 100 mg via EPIDURAL

## 2021-10-18 MED ORDER — LACTATED RINGERS IV SOLN: INTRAVENOUS | Status: DC | PRN

## 2021-10-18 MED ORDER — SOD CITRATE-CITRIC ACID 500-334 MG/5ML PO SOLN: 30.0000 mL | ORAL | Status: DC | PRN

## 2021-10-18 MED ORDER — MISOPROSTOL 25 MCG QUARTER TABLET
25.0000 ug | ORAL_TABLET | ORAL | Status: DC
  Administered 2021-10-18: 25 ug via BUCCAL
  Filled 2021-10-18: qty 1

## 2021-10-18 MED ORDER — OXYCODONE-ACETAMINOPHEN 5-325 MG PO TABS: 2.0000 | ORAL_TABLET | ORAL | Status: DC | PRN

## 2021-10-18 MED ORDER — CEFAZOLIN SODIUM-DEXTROSE 2-4 GM/100ML-% IV SOLN
INTRAVENOUS | Status: AC
  Filled 2021-10-18: qty 100

## 2021-10-18 MED ORDER — SODIUM CHLORIDE 0.9 % IV SOLN
INTRAVENOUS | Status: AC
  Filled 2021-10-18: qty 5

## 2021-10-18 MED ORDER — MISOPROSTOL 200 MCG PO TABS
ORAL_TABLET | ORAL | Status: AC
  Administered 2021-10-18: 25 ug via VAGINAL
  Filled 2021-10-18: qty 4

## 2021-10-18 MED ORDER — PHENYLEPHRINE HCL-NACL 20-0.9 MG/250ML-% IV SOLN
INTRAVENOUS | Status: AC
  Filled 2021-10-18: qty 250

## 2021-10-18 MED ORDER — BUPIVACAINE HCL (PF) 0.5 % IJ SOLN: 5.0000 mL | Freq: Once | INTRAMUSCULAR | Status: DC

## 2021-10-18 MED ORDER — OXYTOCIN-SODIUM CHLORIDE 30-0.9 UT/500ML-% IV SOLN
1.0000 m[IU]/min | INTRAVENOUS | Status: DC
  Administered 2021-10-18: 2 m[IU]/min via INTRAVENOUS
  Filled 2021-10-18: qty 500

## 2021-10-18 MED ORDER — MISOPROSTOL 25 MCG QUARTER TABLET
25.0000 ug | ORAL_TABLET | Freq: Once | ORAL | Status: AC
  Administered 2021-10-18: 25 ug via ORAL
  Filled 2021-10-18: qty 1

## 2021-10-18 MED ORDER — BUPIVACAINE HCL (PF) 0.5 % IJ SOLN
INTRAMUSCULAR | Status: AC
  Filled 2021-10-18: qty 30

## 2021-10-18 MED ORDER — LACTATED RINGERS IV SOLN
500.0000 mL | Freq: Once | INTRAVENOUS | Status: AC
  Administered 2021-10-18: 500 mL via INTRAVENOUS

## 2021-10-18 MED ORDER — ONDANSETRON HCL 4 MG/2ML IJ SOLN
4.0000 mg | Freq: Four times a day (QID) | INTRAMUSCULAR | Status: DC | PRN
  Administered 2021-10-18: 4 mg via INTRAVENOUS
  Filled 2021-10-18: qty 2

## 2021-10-18 MED ORDER — OXYCODONE-ACETAMINOPHEN 5-325 MG PO TABS: 1.0000 | ORAL_TABLET | ORAL | Status: DC | PRN

## 2021-10-18 MED ORDER — FENTANYL-BUPIVACAINE-NACL 0.5-0.125-0.9 MG/250ML-% EP SOLN
EPIDURAL | Status: AC
  Filled 2021-10-18: qty 250

## 2021-10-18 MED ORDER — SOD CITRATE-CITRIC ACID 500-334 MG/5ML PO SOLN
ORAL | Status: AC
  Filled 2021-10-18: qty 15

## 2021-10-18 MED ORDER — OXYTOCIN-SODIUM CHLORIDE 30-0.9 UT/500ML-% IV SOLN: 2.5000 [IU]/h | INTRAVENOUS | Status: DC

## 2021-10-18 MED ORDER — BUPIVACAINE HCL (PF) 0.25 % IJ SOLN
INTRAMUSCULAR | Status: DC | PRN
  Administered 2021-10-18: 4 mL via EPIDURAL
  Administered 2021-10-18: 2 mL via EPIDURAL

## 2021-10-18 MED ORDER — BUPIVACAINE HCL (PF) 0.5 % IJ SOLN
INTRAMUSCULAR | Status: DC | PRN
  Administered 2021-10-18: 10 mL

## 2021-10-18 MED ORDER — LIDOCAINE HCL (PF) 1 % IJ SOLN
INTRAMUSCULAR | Status: AC
  Filled 2021-10-18: qty 30

## 2021-10-18 MED ORDER — OXYTOCIN-SODIUM CHLORIDE 30-0.9 UT/500ML-% IV SOLN
INTRAVENOUS | Status: AC
  Filled 2021-10-18: qty 500

## 2021-10-18 MED ORDER — AMMONIA AROMATIC IN INHA
RESPIRATORY_TRACT | Status: AC
  Filled 2021-10-18: qty 10

## 2021-10-18 MED ORDER — FENTANYL CITRATE (PF) 100 MCG/2ML IJ SOLN
INTRAMUSCULAR | Status: AC
  Filled 2021-10-18: qty 2

## 2021-10-18 MED ORDER — PHENYLEPHRINE HCL-NACL 20-0.9 MG/250ML-% IV SOLN
INTRAVENOUS | Status: DC | PRN
  Administered 2021-10-18: 30 ug/min via INTRAVENOUS

## 2021-10-18 MED ORDER — DIPHENHYDRAMINE HCL 50 MG/ML IJ SOLN: 12.5000 mg | INTRAMUSCULAR | Status: DC | PRN

## 2021-10-18 MED ORDER — OXYTOCIN BOLUS FROM INFUSION: 333.0000 mL | Freq: Once | INTRAVENOUS | Status: DC

## 2021-10-18 MED ORDER — LACTATED RINGERS IV SOLN: INTRAVENOUS | Status: DC

## 2021-10-18 MED ORDER — MISOPROSTOL 25 MCG QUARTER TABLET
25.0000 ug | ORAL_TABLET | ORAL | Status: DC | PRN
  Administered 2021-10-18: 25 ug via VAGINAL
  Filled 2021-10-18 (×2): qty 1

## 2021-10-18 MED ORDER — FENTANYL CITRATE (PF) 100 MCG/2ML IJ SOLN
INTRAMUSCULAR | Status: DC | PRN
  Administered 2021-10-18: 100 ug via EPIDURAL

## 2021-10-18 MED ORDER — BUPIVACAINE 0.25 % ON-Q PUMP DUAL CATH 400 ML
400.0000 mL | INJECTION | Status: DC
  Filled 2021-10-18: qty 400

## 2021-10-18 MED ORDER — LACTATED RINGERS IV SOLN
500.0000 mL | INTRAVENOUS | Status: DC | PRN
  Administered 2021-10-18: 500 mL via INTRAVENOUS

## 2021-10-18 MED ORDER — SODIUM CHLORIDE 0.9 % IV SOLN: 500.0000 mg | INTRAVENOUS | Status: AC

## 2021-10-18 MED ORDER — FENTANYL-BUPIVACAINE-NACL 0.5-0.125-0.9 MG/250ML-% EP SOLN
12.0000 mL/h | EPIDURAL | Status: DC | PRN
  Administered 2021-10-18: 12 mL/h via EPIDURAL

## 2021-10-18 MED ORDER — OXYTOCIN 10 UNIT/ML IJ SOLN
INTRAMUSCULAR | Status: AC
  Filled 2021-10-18: qty 2

## 2021-10-18 MED ORDER — PHENYLEPHRINE 80 MCG/ML (10ML) SYRINGE FOR IV PUSH (FOR BLOOD PRESSURE SUPPORT): 80.0000 ug | PREFILLED_SYRINGE | INTRAVENOUS | Status: DC | PRN

## 2021-10-18 SURGICAL SUPPLY — 33 items
BACTOSHIELD CHG 4% 4OZ (MISCELLANEOUS) ×1
CATH KIT ON-Q SILVERSOAK 5 (CATHETERS) ×2 IMPLANT
CATH KIT ON-Q SILVERSOAK 5IN (CATHETERS) ×4 IMPLANT
DERMABOND ADVANCED (GAUZE/BANDAGES/DRESSINGS) ×1
DERMABOND ADVANCED .7 DNX12 (GAUZE/BANDAGES/DRESSINGS) ×1 IMPLANT
DRSG OPSITE POSTOP 4X10 (GAUZE/BANDAGES/DRESSINGS) ×2 IMPLANT
DRSG TELFA 3X8 NADH (GAUZE/BANDAGES/DRESSINGS) ×2 IMPLANT
ELECT CAUTERY BLADE 6.4 (BLADE) ×2 IMPLANT
ELECT REM PT RETURN 9FT ADLT (ELECTROSURGICAL) ×2
ELECTRODE REM PT RTRN 9FT ADLT (ELECTROSURGICAL) ×1 IMPLANT
GAUZE SPONGE 4X4 12PLY STRL (GAUZE/BANDAGES/DRESSINGS) ×2 IMPLANT
GLOVE BIO SURGEON STRL SZ7 (GLOVE) ×2 IMPLANT
GLOVE SURG UNDER LTX SZ7.5 (GLOVE) ×2 IMPLANT
GOWN STRL REUS W/ TWL LRG LVL3 (GOWN DISPOSABLE) ×3 IMPLANT
GOWN STRL REUS W/TWL LRG LVL3 (GOWN DISPOSABLE) ×3
MANIFOLD NEPTUNE II (INSTRUMENTS) ×2 IMPLANT
MAT PREVALON FULL STRYKER (MISCELLANEOUS) ×2 IMPLANT
NS IRRIG 1000ML POUR BTL (IV SOLUTION) ×2 IMPLANT
PACK C SECTION AR (MISCELLANEOUS) ×2 IMPLANT
PAD DRESSING TELFA 3X8 NADH (GAUZE/BANDAGES/DRESSINGS) ×1 IMPLANT
PAD OB MATERNITY 4.3X12.25 (PERSONAL CARE ITEMS) ×4 IMPLANT
PAD PREP 24X41 OB/GYN DISP (PERSONAL CARE ITEMS) ×2 IMPLANT
SCRUB CHG 4% DYNA-HEX 4OZ (MISCELLANEOUS) ×1 IMPLANT
STRIP CLOSURE SKIN 1/2X4 (GAUZE/BANDAGES/DRESSINGS) ×2 IMPLANT
SUT MNCRL 4-0 (SUTURE) ×1
SUT MNCRL 4-0 27XMFL (SUTURE) ×1
SUT PDS AB 1 TP1 96 (SUTURE) ×2 IMPLANT
SUT PLAIN GUT 0 (SUTURE) IMPLANT
SUT VIC AB 0 CTX 36 (SUTURE) ×2
SUT VIC AB 0 CTX36XBRD ANBCTRL (SUTURE) ×2 IMPLANT
SUTURE MNCRL 4-0 27XMF (SUTURE) ×1 IMPLANT
SWABSTK COMLB BENZOIN TINCTURE (MISCELLANEOUS) ×2 IMPLANT
WATER STERILE IRR 500ML POUR (IV SOLUTION) ×2 IMPLANT

## 2021-10-18 NOTE — Anesthesia Preprocedure Evaluation (Addendum)
Anesthesia Evaluation  Patient identified by MRN, date of birth, ID band Patient awake    Reviewed: Allergy & Precautions, H&P , NPO status , Patient's Chart, lab work & pertinent test results  History of Anesthesia Complications Negative for: history of anesthetic complications  Airway Mallampati: II  TM Distance: >3 FB Neck ROM: full    Dental no notable dental hx. (+) Teeth Intact   Pulmonary neg pulmonary ROS,           Cardiovascular Exercise Tolerance: Good negative cardio ROS       Neuro/Psych negative neurological ROS  negative psych ROS   GI/Hepatic negative GI ROS, Neg liver ROS,   Endo/Other  negative endocrine ROS  Renal/GU negative Renal ROS  negative genitourinary   Musculoskeletal   Abdominal   Peds  Hematology  (+) Blood dyscrasia, anemia ,   Anesthesia Other Findings Past Medical History: No date: Anemia 03/24/2015: Dysplastic nevus     Comment:  left side above waistline, mild atypia, lateral margin               involved.  Epidural seems to be working very well, so we plan to use it for the C-section with a backup of general with an ETT.  Reproductive/Obstetrics (+) Pregnancy                            Anesthesia Physical Anesthesia Plan  ASA: 2  Anesthesia Plan: Epidural   Post-op Pain Management:    Induction:   PONV Risk Score and Plan:   Airway Management Planned:   Additional Equipment:   Intra-op Plan:   Post-operative Plan:   Informed Consent: I have reviewed the patients History and Physical, chart, labs and discussed the procedure including the risks, benefits and alternatives for the proposed anesthesia with the patient or authorized representative who has indicated his/her understanding and acceptance.     Dental Advisory Given  Plan Discussed with: Anesthesiologist and CRNA  Anesthesia Plan Comments:        Anesthesia Quick  Evaluation

## 2021-10-18 NOTE — Op Note (Signed)
Cesarean Section Operative Note    Patient Name: Kim Shannon  MRN: [id number]  Date of Surgery: 10/18/2021   Pre-operative Diagnosis:  1) Fetal intolerance of labor 2) intrauterine pregnancy at [redacted]w[redacted]d  Post-operative Diagnosis:  1) Fetal intolerance of labor 2) intrauterine pregnancy at 373w4d  Procedure: Primary Low transverse cesarean section via Pfannenstiel incision with double layer uterine closure  Surgeon: Surgeon(s) and Role:    * Will BonnetMD - Primary   Assistants: AnDrinda ButtsCNM; No other capable assistant available, in surgery requiring high level assistant.  Anesthesia: epidural   Findings:  1) normal appearing gravid uterus, fallopian tubes, and ovaries 2) viable female infant with weight of 3,460 grams, APGARs 6 and 9   Quantified Blood Loss: 330 mL, EBL= 650 mL  Total IV Fluids: 800 ml   Urine Output:  100 mL clear urine at the end of the case  Specimens: None  Complications: no complications  Disposition: PACU - hemodynamically stable.   Maternal Condition: stable   Baby condition / location:  Couplet care / Skin to Skin  Procedure Details:  The patient was seen in the Holding Room. The risks, benefits, complications, treatment options, and expected outcomes were discussed with the patient. The patient concurred with the proposed plan, giving informed consent. identified as Kim Mediand the procedure verified as C-Section Delivery. A Time Out was held and the above information confirmed.   After induction of anesthesia, the patient was draped and prepped in the usual sterile manner. A Pfannenstiel incision was made and carried down through the subcutaneous tissue to the fascia. Fascial incision was made and extended transversely. The fascia was separated from the underlying rectus tissue superiorly and inferiorly. The peritoneum was identified and entered. Peritoneal incision was extended longitudinally. The bladder flap was  not bluntly or sharply freed from the lower uterine segment. A low transverse uterine incision was made and the hysterotomy was extended with cranial-caudal tension. Delivered from cephalic presentation was a 3,460 gram Living newborn infant(s) or Female with Apgar scores of 6 at one minute and 9 at five minutes. Cord ph was not sent the umbilical cord was clamped and cut cord blood was not obtained for evaluation. The placenta was removed Intact and appeared normal. The uterine outline, tubes and ovaries appeared normal. The uterine incision was closed with running locked sutures of 0 Vicryl.  A second layer of the same suture was thrown in an imbricating fashion.  Hemostasis was assured.  The uterus was returned to the abdomen and the paracolic gutters were cleared of all clots and debris.  The rectus muscles were inspected and found to be hemostatic.  The On-Q catheter pumps were inserted in accordance with the manufacturer's recommendations.  The catheters were inserted approximately 4cm cephelad to the incision line, approximately 1cm apart, straddling the midline.  They were inserted to a depth of the 4th mark. They were positioned superficial to the rectus abdominus muscles and deep to the rectus fascia.    The fascia was then reapproximated with running sutures of 1-0 PDS, looped. The subcuticular closure was performed using 4-0 monocryl. The skin closure was reinforced using surgical skin glue  The On-Q catheters were bolused with 5 mL of 0.5% marcaine plain for a total of 10 mL.  The catheters were affixed to the skin with surgical skin glue, steri-strips, and tegaderm.    The surgical assistant performed tissue retraction, assistance with suturing, and fundal pressure.  Instrument, sponge, and needle counts were correct prior the abdominal closure and were correct at the conclusion of the case.  The patient received Ancef 2 gram IV prior to skin incision (within 30 minutes). For VTE prophylaxis  she was wearing SCDs throughout the case.  The assistant surgeon was a CNM due to lack of availability of another Counselling psychologist.    Signed: Will Bonnet, MD 10/18/2021 11:51 PM

## 2021-10-18 NOTE — H&P (Signed)
OB History & Physical   History of Present Illness:  Chief Complaint:   HPI:  Marguerita Stapp is a 26 y.o. G1P0 female at 41w4ddated by LMP.  She presents to L&D for IOL.  She reports:  -active fetal movement -no leakage of fluid -no vaginal bleeding -no contractions  Pregnancy Issues: Breech at 33wks - Resolved on 09/23/2021 Iron deficiency anemia - chronic  Covid-19 infection in 1st trimester Low vitamin B12   Maternal Medical History:   Past Medical History:  Diagnosis Date   Anemia    Dysplastic nevus 03/24/2015   left side above waistline, mild atypia, lateral margin involved.    Past Surgical History:  Procedure Laterality Date   BREAST BIOPSY Left    WISDOM TOOTH EXTRACTION      No Known Allergies  Prior to Admission medications   Medication Sig Start Date End Date Taking? Authorizing Provider  ferrous sulfate 325 (65 FE) MG tablet Take 28 mg by mouth daily with breakfast.   Yes [provider]  Prenatal Vit-Fe Fumarate-FA (MULTIVITAMIN-PRENATAL) 27-0.8 MG TABS tablet Take 1 tablet by mouth daily at 12 noon.   Yes [provider]  vitamin B-12 (CYANOCOBALAMIN) 500 MCG tablet Take 1,000 mcg by mouth daily.   Yes [provider]     [facility]: She  reports that she has never smoked. She has never used smokeless tobacco. She reports that she does not currently use alcohol after a past usage of about 1.0 standard drink of alcohol per week. She reports that she does not use drugs.  Family History: family history includes Breast cancer in her paternal grandmother; Diabetes in her paternal grandfather; Prostate cancer in her paternal grandfather.   Review of Systems: A full review of systems was performed and negative except as noted in the HPI.    Physical Exam:  Vital Signs: BP 121/81 (BP Location: Left Arm)   Pulse 83   Temp 98.4 F (36.9 C) (Oral)   Resp 18   Ht '5\' 6"'$  (1.676  m)   Wt 88.9 kg   LMP 01/14/2021 (Exact Date)   BMI 31.64 kg/m   General:   alert and cooperative  Skin:  normal  Neurologic:    Alert & oriented x 3  Lungs:    Nl effort  Heart:   regular rate and rhythm  Abdomen:  soft, non-tender; bowel sounds normal; no masses,  no organomegaly  Extremities: : non-tender, symmetric, No edema bilaterally.      EFW: 09/29/21  7lb3oz(3256g)= 80%  Results for orders placed or performed during the hospital encounter of 10/18/21 (from the past 24 hour(s))  CBC with Differential     Status: Abnormal   Collection Time: 10/18/21  1:06 AM  Result Value Ref Range   WBC 14.3 (H) 4.0 - 10.5 K/uL   RBC 4.67 3.87 - 5.11 MIL/uL   Hemoglobin 13.3 12.0 - 15.0 g/dL   HCT 40.1 36.0 - 46.0 %   MCV 85.9 80.0 - 100.0 fL   MCH 28.5 26.0 - 34.0 pg   MCHC 33.2 30.0 - 36.0 g/dL   RDW 13.0 11.5 - 15.5 %   Platelets 153 150 - 400 K/uL   nRBC 0.0 0.0 - 0.2 %   Neutrophils Relative % 67 %   Neutro Abs 9.8 (H) 1.7 - 7.7 K/uL   Lymphocytes Relative 22 %   Lymphs Abs 3.1 0.7 - 4.0 K/uL  Monocytes Relative 8 %   Monocytes Absolute 1.1 (H) 0.1 - 1.0 K/uL   Eosinophils Relative 1 %   Eosinophils Absolute 0.1 0.0 - 0.5 K/uL   Basophils Relative 1 %   Basophils Absolute 0.1 0.0 - 0.1 K/uL   Immature Granulocytes 1 %   Abs Immature Granulocytes 0.08 (H) 0.00 - 0.07 K/uL  Type and screen [place]     Status: None   Collection Time: 10/18/21  1:06 AM  Result Value Ref Range   ABO/RH(D) A POS    Antibody Screen NEG    Sample Expiration      10/21/2021,2359 Performed at [facility], [address] [place], [place] [address]   ABO/Rh     Status: None   Collection Time: 10/18/21  1:12 AM  Result Value Ref Range   ABO/RH(D)      A POS Performed at [facility], [address] [place], [place] [address]     Pertinent Results:  Prenatal Labs: Blood type/Rh A pos  Antibody screen neg  Rubella Immune  Varicella Immune   RPR NR  HBsAg Neg  HIV NR  GC neg  Chlamydia neg  Genetic screening negative  1 hour GTT 97  3 hour GTT   GBS Neg   FHT: FHR: 120 bpm, variability: moderate,  accelerations:  Present,  decelerations:  Absent Category/reactivity:  Category I TOCO: none SVE: Dilation: Closed / Effacement (%): Thick / Station: -2    Cephalic by [employer]  US OB Limited  Result Date: 09/27/2021 CLINICAL DATA:  Decreased fetal movement. EXAM: LIMITED OBSTETRIC ULTRASOUND COMPARISON:  None Available. FINDINGS: Number of Fetuses: 1 Heart Rate:  138 bpm Movement: Yes Presentation: Breech Placental Location: Anterior Previa: No Amniotic Fluid (Subjective):  Within normal limits. AFI: 10.5 cm BPD: 9.43  cm 38 w  3 d MATERNAL FINDINGS: Cervix: Not visualized Uterus/Adnexae: No abnormality visualized. IMPRESSION: Single, viable intrauterine pregnancy at approximately 38 weeks and 3 days gestation by ultrasound evaluation. This exam is performed on an emergent basis and does not comprehensively evaluate fetal size, dating, or anatomy; follow-up complete OB US should be considered if further fetal assessment is warranted. Electronically Signed   By: Virgina Norfolk M.D.   On: 09/27/2021 17:55     Assessment:  Owen Alliana Mcauliff is a 26 y.o. G1P0 female at 46w4dhere for IOL.   Plan:  1. Admit to Labor & Delivery; consents reviewed and obtained  2. Fetal Well being  - Fetal Tracing: Cat I - GBS neg - Presentation: vtx confirmed by sve   3. Routine OB: - Prenatal labs reviewed, as above - Rh pos - CBC & T&S on admit - Clear fluids, IVF  4. Induction of Labor -  Contractions by external toco in place -  Plan for induction with Cytotec -  Plan for continuous fetal monitoring  -  Maternal pain control as desired: IVPM, nitrous, regional anesthesia - Anticipate vaginal delivery  5. Post Partum Planning: - Infant feeding: TBD - Contraception: TBD - Tdap: given 08/05/21  JLinda Hedges CNM 10/18/2021  6:07 AM

## 2021-10-18 NOTE — Progress Notes (Signed)
Labor Progress Note  Kim Shannon is a 26 y.o. G1P0 at 50w4dby LMP admitted for induction of labor due to unstable lie.  Subjective: contractions are feeling stronger, coping well with them   Objective: BP 103/80 (BP Location: Left Arm)   Pulse 60   Temp 98.1 F (36.7 C) (Oral)   Resp 18   Ht '5\' 6"'$  (1.676 m)   Wt 88.9 kg   LMP 01/14/2021 (Exact Date)   BMI 31.64 kg/m  Notable VS details: reviewed   Fetal Assessment: FHT:  FHR: 135 bpm, variability: moderate,  accelerations:  Present,  decelerations:  Present occasional variable and early decels Category/reactivity:  Category II-> Category I UC:   regular, every 2-4 minutes SVE:   3/80/-1/soft/ant Membrane status: SROM at 1420 Amniotic color: Clear   Labs: Lab Results  Component Value Date   WBC 14.3 (H) 10/18/2021   HGB 13.3 10/18/2021   HCT 40.1 10/18/2021   MCV 85.9 10/18/2021   PLT 153 10/18/2021    Assessment / Plan: Induction of labor d/t unstable lie  -S/P 3 doses of vaginal misoprostol and 2 doses of oral misoprostol  -Progressing well after SROM -Discussed starting augmentation with oxytocin now versus waiting a couple of hours and assessing labor progress at that time.  Kim Shannon consents to starting low dose oxytocin now -Dr. JGlennon Macupdated on progress and starting oxytocin   Labor: Progressing normally Fetal Wellbeing:  Category II-> Category I  Pain Control:  Labor support without medications - planning epidural I/D:   ROM x 3 hours, GBS neg, afebrile  Anticipated MOD:  NSVD  AMinda Meo CNM 10/18/2021, 5:06 PM

## 2021-10-18 NOTE — Progress Notes (Signed)
Labor Check  Subj:  Complaints: patient comfortable with epidural.  Called to see patient by CNM due to recurrent late decelerations.    Over the past 4 hour the patient has had runs of more subtle late decelerations, deeper decelerations (as low as the 60s), but has consistently had decelerations.  The variability has remained moderate.  There have been decelerations. A long discussion with the patient regarding her progress, her current fetal heart rate tracing, which is category 2 with recurrent late decelerations.  We discussed that we could continue with attempted labor and there is a possibility of a vaginal delivery. However, we discussed that I have concerns that given the prior 4 hours of fetal tracing she has a strong possibility of having significant fetal distress resulting in an emergency c-section.   After a lengthy discussion, the patient elects to undergo cesarean delivery.  I believe this is indicated at this point, given the amount of time her decelerations have persisted, their resistance to intrauterine resuscitative measures, and her being remote from delivery at this point.  I personally discussed the risks and benefits of the cesarean section. All questions answered.   Obj:  BP 119/76 (BP Location: Left Arm)   Pulse (!) 57   Temp 98.4 F (36.9 C) (Axillary)   Resp 16   Ht '5\' 6"'$  (1.676 m)   Wt 88.9 kg   LMP 01/14/2021 (Exact Date)   SpO2 95%   BMI 31.64 kg/m  Dose (milli-units/min) Oxytocin: 0 milli-units/min  Cervix: Dilation: 6 / Effacement (%): 90 / Station: -1   Prentice Docker, MD, [facility] 10/18/2021 10:14 PM   10/18/2021 10:14 PM

## 2021-10-18 NOTE — Progress Notes (Signed)
Labor Progress Note  Kim Shannon is a 26 y.o. G1P0 at 62w4dby LMP admitted for induction of labor due to unstable lie.  Subjective: feeling more comfortable with epidural   Objective: BP 119/76 (BP Location: Left Arm)   Pulse (!) 57   Temp 98.4 F (36.9 C) (Axillary)   Resp 16   Ht '5\' 6"'$  (1.676 m)   Wt 88.9 kg   LMP 01/14/2021 (Exact Date)   SpO2 95%   BMI 31.64 kg/m  Notable VS details: reviewed   Fetal Assessment: FHT:  FHR: 140 bpm, variability: moderate,  accelerations:  Present,  decelerations:  Present late, variable, prolong, and early decels Category/reactivity:  Category II UC:   regular, every 2-4 minutes, IUPC placed  SVE: 5/90/-1 - molding noted with exam    Membrane status: SROM at 1420 Amniotic color: clear   Labs: Lab Results  Component Value Date   WBC 14.3 (H) 10/18/2021   HGB 13.3 10/18/2021   HCT 40.1 10/18/2021   MCV 85.9 10/18/2021   PLT 153 10/18/2021    Assessment / Plan: Induction of labor d/t unstable lie -S/P 3 doses of vaginal misoprostol and 2 doses of oral misoprostol  -Oxytocin started and then discontinued d/t decelerations  -Conservative measures used for intrauterine resuscitation - position changes and IVF bolus  -Will continue to monitor and restart oxytocin as appropriate  -Dr. JGlennon Macnotified of EFM and interventions   Labor: Progressing normally Fetal Wellbeing:  Category II - overall reassuring with moderate variability  Pain Control:  Epidural I/D:   ROM x 6 hours, afebrile, GBS neg Anticipated MOD:  NSVD  AMinda Meo CNM 10/18/2021, 8:28 PM

## 2021-10-18 NOTE — Progress Notes (Signed)
Labor Progress Note  Taila Victora Irby is a 26 y.o. G1P0 at 52w4dby LMP admitted for induction of labor due to unstable lie.  Subjective: feeling more comfortable with epidural   Objective: BP 119/76 (BP Location: Left Arm)   Pulse (!) 57   Temp 98.4 F (36.9 C) (Axillary)   Resp 16   Ht '5\' 6"'$  (1.676 m)   Wt 88.9 kg   LMP 01/14/2021 (Exact Date)   SpO2 95%   BMI 31.64 kg/m  Notable VS details: reviewed   Fetal Assessment: FHT:  FHR: 140 bpm, variability: moderate,  accelerations:  Present,  decelerations:  Present late, variable, prolong, and early decels Category/reactivity:  Category II UC:   regular, every 2-4 minutes, IUPC placed  SVE: 5/90/-1 - molding noted with exam    Membrane status: SROM at 1420 Amniotic color: clear   Labs: Lab Results  Component Value Date   WBC 14.3 (H) 10/18/2021   HGB 13.3 10/18/2021   HCT 40.1 10/18/2021   MCV 85.9 10/18/2021   PLT 153 10/18/2021    Assessment / Plan: Induction of labor d/t unstable lie -S/P 3 doses of vaginal misoprostol and 2 doses of oral misoprostol  -Oxytocin started and then discontinued d/t decelerations  -Conservative measures used for intrauterine resuscitation - position changes and IVF bolus  -I discussed my concern about the FHR with Laurend and her family.  I am concerned about the continued Cat II tracing despite interventions.  It's reasonable to continue to monitor and support with intrauterine resuscitation measures if her labor continues to progress well but I would recommend a primary c/s if decelerations continue and she is remote from delivery.  -Dr. JGlennon Macon unit and EFM reviewed.    Labor: Progressing normally Fetal Wellbeing:  Category II - overall reassuring with moderate variability  Pain Control:  Epidural I/D:   ROM x 6 hours, afebrile, GBS neg Anticipated MOD:  guarded for primary c/s d/t NRFHR  AMinda Meo CNM 10/18/2021, 9:24 PM

## 2021-10-18 NOTE — Progress Notes (Signed)
Labor Progress Note  Harlo Denissa Cozart is a 26 y.o. G1P0 at 5w4dby LMP admitted for induction of labor due to unstable lie.  Subjective: starting to feel more cramping   Objective: BP 114/78   Pulse (!) 57   Temp 98 F (36.7 C) (Oral)   Resp 18   Ht '5\' 6"'$  (1.676 m)   Wt 88.9 kg   LMP 01/14/2021 (Exact Date)   BMI 31.64 kg/m  Notable VS details: reviewed   Fetal Assessment: FHT:  FHR: 130 bpm, variability: moderate,  accelerations:  Present,  decelerations:  Absent Category/reactivity:  Category I UC:   mild contractions every 2-5 min SVE:   04/11/48/-2/med/mid  Membrane status: Intact Amniotic color: N/A   Labs: Lab Results  Component Value Date   WBC 14.3 (H) 10/18/2021   HGB 13.3 10/18/2021   HCT 40.1 10/18/2021   MCV 85.9 10/18/2021   PLT 153 10/18/2021    Assessment / Plan: Induction of labor d/t unstable lie  -S/P 2 doses of vaginal and oral misoprostol  -3rd dose of vaginal misoprostol placed   Labor:  cervical ripening with misoprostol  Fetal Wellbeing:  Category I Pain Control:  Labor support without medications I/D:  n/a Anticipated MOD:  NSVD  AMinda Meo CNM 10/18/2021, 11:29 AM

## 2021-10-18 NOTE — Anesthesia Procedure Notes (Signed)
Epidural Patient location during procedure: OB Start time: 10/18/2021 5:37 PM End time: 10/18/2021 5:45 PM  Staffing Anesthesiologist: Martha Clan, MD Resident/CRNA: Hedda Slade, CRNA Performed: resident/CRNA   Preanesthetic Checklist Completed: patient identified, IV checked, site marked, risks and benefits discussed, surgical consent, monitors and equipment checked, pre-op evaluation and timeout performed  Epidural Patient position: sitting Prep: ChloraPrep Patient monitoring: heart rate, continuous pulse ox and blood pressure Approach: midline Location: L3-L4 Injection technique: LOR air  Needle:  Needle type: Tuohy  Needle gauge: 17 G Needle length: 9 cm and 9 Needle insertion depth: 5 cm Catheter type: closed end flexible Catheter size: 19 Gauge Catheter at skin depth: 10 cm Test dose: negative and 1.5% lidocaine with Epi 1:200 K  Assessment Events: blood not aspirated, injection not painful, no injection resistance, no paresthesia and negative IV test  Additional Notes 1 attempt Pt. Evaluated and documentation done after procedure finished. Patient identified. Risks/Benefits/Options discussed with patient including but not limited to bleeding, infection, nerve damage, paralysis, failed block, incomplete pain control, headache, blood pressure changes, nausea, vomiting, reactions to medication both or allergic, itching and postpartum back pain. Confirmed with bedside nurse the patient's most recent platelet count. Confirmed with patient that they are not currently taking any anticoagulation, have any bleeding history or any family history of bleeding disorders. Patient expressed understanding and wished to proceed. All questions were answered. Sterile technique was used throughout the entire procedure. Please see nursing notes for vital signs. Test dose was given through epidural catheter and negative prior to continuing to dose epidural or start infusion. Warning signs of  high block given to the patient including shortness of breath, tingling/numbness in hands, complete motor block, or any concerning symptoms with instructions to call for help. Patient was given instructions on fall risk and not to get out of bed. All questions and concerns addressed with instructions to call with any issues or inadequate analgesia.    Patient tolerated the insertion well without immediate complications.Reason for block:procedure for pain

## 2021-10-19 ENCOUNTER — Encounter: Payer: Self-pay | Admitting: Obstetrics and Gynecology

## 2021-10-19 LAB — CBC
HCT: 36 % (ref 36.0–46.0)
Hemoglobin: 11.9 g/dL — ABNORMAL LOW (ref 12.0–15.0)
MCH: 28.5 pg (ref 26.0–34.0)
MCHC: 33.1 g/dL (ref 30.0–36.0)
MCV: 86.3 fL (ref 80.0–100.0)
Platelets: 112 10*3/uL — ABNORMAL LOW (ref 150–400)
RBC: 4.17 MIL/uL (ref 3.87–5.11)
RDW: 13.1 % (ref 11.5–15.5)
WBC: 17.2 10*3/uL — ABNORMAL HIGH (ref 4.0–10.5)
nRBC: 0 % (ref 0.0–0.2)

## 2021-10-19 MED ORDER — SIMETHICONE 80 MG PO CHEW
80.0000 mg | CHEWABLE_TABLET | Freq: Three times a day (TID) | ORAL | Status: DC
  Administered 2021-10-19 – 2021-10-20 (×4): 80 mg via ORAL
  Filled 2021-10-19 (×4): qty 1

## 2021-10-19 MED ORDER — COCONUT OIL OIL: 1.0000 | TOPICAL_OIL | Status: DC | PRN

## 2021-10-19 MED ORDER — IBUPROFEN 600 MG PO TABS
600.0000 mg | ORAL_TABLET | Freq: Four times a day (QID) | ORAL | Status: DC
  Administered 2021-10-19 – 2021-10-20 (×3): 600 mg via ORAL
  Filled 2021-10-19 (×3): qty 1

## 2021-10-19 MED ORDER — LACTATED RINGERS IV SOLN: INTRAVENOUS | Status: DC

## 2021-10-19 MED ORDER — SODIUM CHLORIDE 0.9% FLUSH: 3.0000 mL | INTRAVENOUS | Status: DC | PRN

## 2021-10-19 MED ORDER — OXYCODONE-ACETAMINOPHEN 5-325 MG PO TABS: 2.0000 | ORAL_TABLET | ORAL | Status: DC | PRN

## 2021-10-19 MED ORDER — KETOROLAC TROMETHAMINE 30 MG/ML IJ SOLN
30.0000 mg | Freq: Four times a day (QID) | INTRAMUSCULAR | Status: AC | PRN
Start: 2021-10-19 — End: 2021-10-20
  Administered 2021-10-19 (×3): 30 mg via INTRAVENOUS
  Filled 2021-10-19 (×3): qty 1

## 2021-10-19 MED ORDER — OXYCODONE HCL 5 MG PO TABS: 5.0000 mg | ORAL_TABLET | Freq: Four times a day (QID) | ORAL | Status: DC | PRN

## 2021-10-19 MED ORDER — ACETAMINOPHEN 500 MG PO TABS
1000.0000 mg | ORAL_TABLET | Freq: Four times a day (QID) | ORAL | Status: DC
  Administered 2021-10-19 – 2021-10-20 (×5): 1000 mg via ORAL
  Filled 2021-10-19 (×5): qty 2

## 2021-10-19 MED ORDER — OXYTOCIN-SODIUM CHLORIDE 30-0.9 UT/500ML-% IV SOLN
2.5000 [IU]/h | INTRAVENOUS | Status: AC
  Administered 2021-10-19: 2.5 [IU]/h via INTRAVENOUS

## 2021-10-19 MED ORDER — MEPERIDINE HCL 25 MG/ML IJ SOLN: 6.2500 mg | INTRAMUSCULAR | Status: DC | PRN

## 2021-10-19 MED ORDER — MENTHOL 3 MG MT LOZG: 1.0000 | LOZENGE | OROMUCOSAL | Status: DC | PRN

## 2021-10-19 MED ORDER — NALOXONE HCL 0.4 MG/ML IJ SOLN: 0.4000 mg | INTRAMUSCULAR | Status: DC | PRN

## 2021-10-19 MED ORDER — FERROUS SULFATE 325 (65 FE) MG PO TABS
325.0000 mg | ORAL_TABLET | Freq: Two times a day (BID) | ORAL | Status: DC
  Administered 2021-10-19 – 2021-10-20 (×3): 325 mg via ORAL
  Filled 2021-10-19 (×3): qty 1

## 2021-10-19 MED ORDER — DIPHENHYDRAMINE HCL 50 MG/ML IJ SOLN: 12.5000 mg | INTRAMUSCULAR | Status: DC | PRN

## 2021-10-19 MED ORDER — SENNOSIDES-DOCUSATE SODIUM 8.6-50 MG PO TABS
2.0000 | ORAL_TABLET | ORAL | Status: DC
  Administered 2021-10-19 – 2021-10-20 (×2): 2 via ORAL
  Filled 2021-10-19 (×2): qty 2

## 2021-10-19 MED ORDER — OXYCODONE-ACETAMINOPHEN 5-325 MG PO TABS: 1.0000 | ORAL_TABLET | ORAL | Status: DC | PRN

## 2021-10-19 MED ORDER — NALOXONE HCL 4 MG/10ML IJ SOLN: 1.0000 ug/kg/h | INTRAVENOUS | Status: DC | PRN

## 2021-10-19 MED ORDER — PROMETHAZINE HCL 25 MG/ML IJ SOLN: 6.2500 mg | INTRAMUSCULAR | Status: DC | PRN

## 2021-10-19 MED ORDER — DIPHENHYDRAMINE HCL 25 MG PO CAPS: 25.0000 mg | ORAL_CAPSULE | Freq: Four times a day (QID) | ORAL | Status: DC | PRN

## 2021-10-19 MED ORDER — FENTANYL CITRATE (PF) 100 MCG/2ML IJ SOLN: 25.0000 ug | INTRAMUSCULAR | Status: DC | PRN

## 2021-10-19 MED ORDER — WITCH HAZEL-GLYCERIN EX PADS: 1.0000 | MEDICATED_PAD | CUTANEOUS | Status: DC | PRN

## 2021-10-19 MED ORDER — PRENATAL MULTIVITAMIN CH
1.0000 | ORAL_TABLET | Freq: Every day | ORAL | Status: DC
  Administered 2021-10-19 – 2021-10-20 (×2): 1 via ORAL
  Filled 2021-10-19 (×2): qty 1

## 2021-10-19 MED ORDER — ONDANSETRON HCL 4 MG/2ML IJ SOLN: 4.0000 mg | Freq: Three times a day (TID) | INTRAMUSCULAR | Status: DC | PRN

## 2021-10-19 MED ORDER — DIPHENHYDRAMINE HCL 25 MG PO CAPS: 25.0000 mg | ORAL_CAPSULE | ORAL | Status: DC | PRN

## 2021-10-19 MED ORDER — KETOROLAC TROMETHAMINE 30 MG/ML IJ SOLN
30.0000 mg | Freq: Four times a day (QID) | INTRAMUSCULAR | Status: AC | PRN
Start: 2021-10-19 — End: 2021-10-20

## 2021-10-19 MED ORDER — DIBUCAINE (PERIANAL) 1 % EX OINT: 1.0000 | TOPICAL_OINTMENT | CUTANEOUS | Status: DC | PRN

## 2021-10-19 NOTE — Anesthesia Postprocedure Evaluation (Signed)
Anesthesia Post Note  Patient: Kim Shannon  Procedure(s) Performed: [place]  Patient location during evaluation: Mother Baby Anesthesia Type: Epidural Level of consciousness: awake, awake and alert, oriented and patient cooperative Pain management: pain level controlled Respiratory status: spontaneous breathing, nonlabored ventilation and respiratory function stable Cardiovascular status: stable Postop Assessment: no headache, no backache, patient able to bend at knees, adequate PO intake, able to ambulate and no apparent nausea or vomiting Anesthetic complications: no   No notable events documented.   Last Vitals:  Vitals:   10/19/21 0523 10/19/21 0603  BP:    Pulse:    Resp:    Temp:    SpO2: 94% 94%    Last Pain:  Vitals:   10/19/21 0345  TempSrc: Oral  PainSc:                  Ricki Miller

## 2021-10-19 NOTE — Transfer of Care (Signed)
Immediate Anesthesia Transfer of Care Note  Patient: Kim Shannon  Procedure(s) Performed: CESAREAN SECTION  Patient Location: PACU  Anesthesia Type:Epidural  Level of Consciousness: awake, alert , oriented, sedated and patient cooperative  Airway & Oxygen Therapy: Patient Spontanous Breathing  Post-op Assessment: Report given to RN and Post -op Vital signs reviewed and stable  Post vital signs: Reviewed and stable  Last Vitals:  Vitals Value Taken Time  BP    Temp    Pulse    Resp    SpO2      Last Pain:  Vitals:   10/18/21 1915  TempSrc: Axillary  PainSc:       Patients Stated Pain Goal: 0 (79/53/69 2230)  Complications: No notable events documented.

## 2021-10-19 NOTE — Progress Notes (Signed)
Postop Day  1  Subjective: no complaints, up ad lib, and voiding  Doing well, no concerns. Ambulating without difficulty, pain managed with PO meds, tolerating regular diet, and voiding without difficulty.   No fever/chills, chest pain, shortness of breath, nausea/vomiting, or leg pain. No nipple or breast pain. No headache, visual changes, or RUQ/epigastric pain.  Objective: BP 126/89 (BP Location: Right Arm)   Pulse 65   Temp 97.8 F (36.6 C) (Oral)   Resp 17   Ht '5\' 6"'$  (1.676 m)   Wt 88.9 kg   LMP 01/14/2021 (Exact Date)   SpO2 100%   Breastfeeding Unknown   BMI 31.64 kg/m    Physical Exam:  General: alert, cooperative, and appears stated age Breasts: soft/nontender CV: RRR Pulm: nl effort, CTABL Abdomen: soft, non-tender, active bowel sounds Uterine Fundus: firm Incision: healing well, no significant drainage, no dehiscence, no significant erythema Perineum: minimal edema,  Lochia: appropriate DVT Evaluation: No evidence of DVT seen on physical exam. Negative Homan's sign. No cords or calf tenderness. No significant calf/ankle edema.  Recent Labs    10/18/21 0106 10/19/21 0610  HGB 13.3 11.9*  HCT 40.1 36.0  WBC 14.3* 17.2*  PLT 153 112*    Assessment/Plan: 26 y.o. G1P1001 postpartum day # 1  -Continue routine postpartum care -Lactation consult PRN for breastfeeding  - patient undecided on contraceptives at this time -Acute blood loss anemia - hemodynamically stable and asymptomatic; start PO ferrous sulfate BID with stool softeners  -Immunization status:   all immunizations up to date   Disposition: Continue inpatient postpartum care    LOS: 1 day   [place], CNM 10/19/2021, 9:14 AM   ----- Avelino Leeds Certified Nurse Midwife [facility]

## 2021-10-19 NOTE — Discharge Summary (Signed)
Postpartum Discharge Summary    Patient Name: Kim Shannon DOB: 03/27/1996 MRN: [id number]  Date of admission: 10/18/2021 Delivery date: 10/18/2021  Delivering provider: Prentice Docker D  Date of discharge: 10/20/2021  Admitting diagnosis: Encounter for elective induction of labor [Z34.90] Intrauterine pregnancy: [redacted]w[redacted]d    Secondary diagnosis:  Principal Problem:   Encounter for elective induction of labor Active Problems:   Encounter for screening mammogram for malignant neoplasm of breast   [redacted] weeks gestation of pregnancy  Additional problems: none    Discharge diagnosis: Term Pregnancy Delivered                                              Post partum procedures: none Augmentation: AROM, Pitocin, and Cytotec Complications: None  Hospital course: Induction of Labor With Cesarean Section   26y.o. yo G1P0 at 341w5das admitted to the hospital 10/18/2021 for induction of labor. Patient had a labor course significant for progression to 6 cm. The fetus began having recurrent late decelerations that did not resolve with intrauterine resuscitative measures.. The patient went for cesarean section due to fetal intolerance of labor. Delivery details are as follows: Membrane Rupture Time/Date: 11:06 PM ,10/18/2021   Delivery Method:C-Section, Low Transverse  Details of operation can be found in separate operative Note.  Patient had an uncomplicated postpartum course. She is ambulating, tolerating a regular diet, passing flatus, and urinating well.  Patient is discharged home in stable condition on 10/20/21.      Newborn Data: Birth date:10/18/2021  Birth time:11:11 PM  Gender:Female  Living status:Living  Apgars:6 ,9  Weight:3460 g 3,460 grams  Magnesium Sulfate received: No BMZ received: No Rhophylac:N/A MMR:No T-DaP:Given prenatally 08/05/21 Flu: No Transfusion:No  Physical exam  Vitals:   10/19/21 1930 10/19/21 2145 10/19/21 2300 10/20/21 0803  BP:   124/84 117/79   Pulse:   61 70  Resp:   16 18  Temp:   98.4 F (36.9 C) 98 F (36.7 C)  TempSrc:   Oral   SpO2: 96% 97% 100% 100%  Weight:      Height:       General: alert, cooperative, and no distress Lochia: appropriate Uterine Fundus: firm Incision: Healing well with no significant drainage; honeycomb intact DVT Evaluation: No evidence of DVT seen on physical exam. Labs: Lab Results  Component Value Date   WBC 17.2 (H) 10/19/2021   HGB 11.9 (L) 10/19/2021   HCT 36.0 10/19/2021   MCV 86.3 10/19/2021   PLT 112 (L) 10/19/2021      Latest Ref Rng & Units 02/04/2021    7:11 AM  CMP  Glucose 70 - 99 mg/dL 81   BUN 6 - 20 mg/dL 9   Creatinine 0.57 - 1.00 mg/dL 0.80   Sodium 134 - 144 mmol/L 139   Potassium 3.5 - 5.2 mmol/L 4.8   Chloride 96 - 106 mmol/L 103   CO2 20 - 29 mmol/L 23   Calcium 8.7 - 10.2 mg/dL 9.2   Total Protein 6.0 - 8.5 g/dL 6.8   Total Bilirubin 0.0 - 1.2 mg/dL 0.4   Alkaline Phos 44 - 121 IU/L 66   AST 0 - 40 IU/L 32   ALT 0 - 32 IU/L 30    Edinburgh Score:    10/19/2021    9:09 AM  Edinburgh Postnatal Depression Scale Screening Tool  I  have been able to laugh and see the funny side of things. 0  I have looked forward with enjoyment to things. 0  I have blamed myself unnecessarily when things went wrong. 1  I have been anxious or worried for no good reason. 0  I have felt scared or panicky for no good reason. 0  Things have been getting on top of me. 0  I have been so unhappy that I have had difficulty sleeping. 0  I have felt sad or miserable. 0  I have been so unhappy that I have been crying. 0  The thought of harming myself has occurred to me. 0  Edinburgh Postnatal Depression Scale Total 1      After visit meds:  Allergies as of 10/20/2021   No Known Allergies      Medication List     TAKE these medications    acetaminophen 500 MG tablet Commonly known as: TYLENOL Take 2 tablets (1,000 mg total) by mouth every 6 (six) hours.   coconut  oil Oil Apply 1 Application topically as needed.   diphenhydrAMINE 25 mg capsule Commonly known as: BENADRYL Take 1 capsule (25 mg total) by mouth every 6 (six) hours as needed for itching.   ferrous sulfate 325 (65 FE) MG tablet Take 28 mg by mouth daily with breakfast.   ibuprofen 600 MG tablet Commonly known as: ADVIL Take 1 tablet (600 mg total) by mouth every 6 (six) hours.   multivitamin-prenatal 27-0.8 MG Tabs tablet Take 1 tablet by mouth daily at 12 noon.   oxyCODONE 5 MG immediate release tablet Commonly known as: Oxy IR/ROXICODONE Take 1 tablet (5 mg total) by mouth every 6 (six) hours as needed for up to 7 days for severe pain.   senna-docusate 8.6-50 MG tablet Commonly known as: Senokot-S Take 2 tablets by mouth daily.   simethicone 80 MG chewable tablet Commonly known as: MYLICON Chew 1 tablet (80 mg total) by mouth 3 (three) times daily after meals.   vitamin B-12 500 MCG tablet Commonly known as: CYANOCOBALAMIN Take 1,000 mcg by mouth daily.         Discharge home in stable condition Infant Feeding: Breast Infant Disposition:home with mother Discharge instruction: per After Visit Summary and Postpartum booklet. Activity: Advance as tolerated. Pelvic rest for 6 weeks.  Diet: routine diet Anticipated Birth Control: Unsure Postpartum Appointment:6 weeks Additional Postpartum F/U: Incision check 1 week Future Appointments: Future Appointments  Date Time Provider [place]  11/24/2021  3:30 PM Laurence Ferrari, [place], MD [facility] None  01/19/2022 11:00 AM McDonough, Si Gaul, PA-C [facility] None   Follow up Visit:  Follow-up Information     Will Bonnet, MD. Schedule an appointment as soon as possible for a visit in 1 week(s).   Specialty: Obstetrics and Gynecology Why: For incision check Contact information: [address] [address] [phone]                 SIGNED: Francetta Found, CNM 10/20/2021 12:04  PM

## 2021-10-20 MED ORDER — DIPHENHYDRAMINE HCL 25 MG PO CAPS: 25.0000 mg | ORAL_CAPSULE | Freq: Four times a day (QID) | ORAL | 0 refills | Status: DC | PRN

## 2021-10-20 MED ORDER — IBUPROFEN 600 MG PO TABS: 600.0000 mg | ORAL_TABLET | Freq: Four times a day (QID) | ORAL | 0 refills | Status: DC

## 2021-10-20 MED ORDER — ACETAMINOPHEN 500 MG PO TABS
1000.0000 mg | ORAL_TABLET | Freq: Four times a day (QID) | ORAL | 0 refills | Status: DC
Start: 2021-10-20 — End: 2022-06-30

## 2021-10-20 MED ORDER — COCONUT OIL OIL: 1.0000 | TOPICAL_OIL | 0 refills | Status: DC | PRN

## 2021-10-20 MED ORDER — OXYCODONE HCL 5 MG PO TABS: 5.0000 mg | ORAL_TABLET | Freq: Four times a day (QID) | ORAL | 0 refills | Status: AC | PRN

## 2021-10-20 MED ORDER — SIMETHICONE 80 MG PO CHEW: 80.0000 mg | CHEWABLE_TABLET | Freq: Three times a day (TID) | ORAL | 0 refills | Status: DC

## 2021-10-20 MED ORDER — SENNOSIDES-DOCUSATE SODIUM 8.6-50 MG PO TABS: 2.0000 | ORAL_TABLET | ORAL | 0 refills | Status: DC

## 2021-10-20 NOTE — Lactation Note (Signed)
This note was copied from a baby's chart. Lactation Consultation Note  Patient Name: Girl Krishika Bugge OLIDC'V Date: 10/20/2021 Reason for consult: Initial assessment;[place];Term Age:28 hours  Maternal Data This is mom's first baby, born by C/S due to fetal intolerance. Per mom baby has been latching and breastfeeding well since the first feed. Mom describes hand expressing at times to encourage baby to latch.   Has patient been taught Hand Expression?: Yes Does the patient have breastfeeding experience prior to this delivery?: No  Feeding Mother's Current Feeding Choice: Breast Milk  Interventions Interventions: Breast feeding basics reviewed;Education Provided mom with [place] phone number if she would like a latch and breastfeeding observation/check. Mom reports her care nurse has helped her with breastfeeding yesterday.   Discharge Discharge Education: Engorgement and breast care;Warning signs for feeding baby;Outpatient recommendation (Provided LC contact information for [facility] Mom will bring baby for follow-up to [facility] Pump: Personal (Mom has personal DEBP for home use.)  Consult Status Consult Status: PRN  Update provided to care nurse.  Jonna Briar Witherspoon 10/20/2021, 10:29 AM

## 2021-10-20 NOTE — Progress Notes (Signed)
Mother discharged. Discharge instructions given. Mother verbalizes understanding. Transported by axillary.

## 2021-11-24 ENCOUNTER — Ambulatory Visit (INDEPENDENT_AMBULATORY_CARE_PROVIDER_SITE_OTHER): Payer: BC Managed Care – PPO | Admitting: Dermatology

## 2021-11-24 DIAGNOSIS — L821 Other seborrheic keratosis: Secondary | ICD-10-CM | POA: Diagnosis not present

## 2021-11-24 DIAGNOSIS — L814 Other melanin hyperpigmentation: Secondary | ICD-10-CM

## 2021-11-24 DIAGNOSIS — L578 Other skin changes due to chronic exposure to nonionizing radiation: Secondary | ICD-10-CM

## 2021-11-24 DIAGNOSIS — Z86018 Personal history of other benign neoplasm: Secondary | ICD-10-CM

## 2021-11-24 DIAGNOSIS — D1800 Hemangioma unspecified site: Secondary | ICD-10-CM

## 2021-11-24 DIAGNOSIS — Z1283 Encounter for screening for malignant neoplasm of skin: Secondary | ICD-10-CM | POA: Diagnosis not present

## 2021-11-24 DIAGNOSIS — L700 Acne vulgaris: Secondary | ICD-10-CM

## 2021-11-24 DIAGNOSIS — D229 Melanocytic nevi, unspecified: Secondary | ICD-10-CM

## 2021-11-24 MED ORDER — CLINDAMYCIN PHOSPHATE 1 % EX SOLN: CUTANEOUS | 3 refills | Status: DC

## 2021-11-24 NOTE — Patient Instructions (Addendum)
Recommend using Cln Acne Wash daily, leave on for 1-2 minutes before rinsing off. This can be purchased at [employer] or online.   Due to recent changes in healthcare laws, you may see results of your pathology and/or laboratory studies on MyChart before the doctors have had a chance to review them. We understand that in some cases there may be results that are confusing or concerning to you. Please understand that not all results are received at the same time and often the doctors may need to interpret multiple results in order to provide you with the best plan of care or course of treatment. Therefore, we ask that you please give [place] 2 business days to thoroughly review all your results before contacting the office for clarification. Should we see a critical lab result, you will be contacted sooner.   If You Need Anything After Your Visit  If you have any questions or concerns for your doctor, please call our main line at [phone] and press option 4 to reach your doctor's medical assistant. If no one answers, please leave a voicemail as directed and we will return your call as soon as possible. Messages left after 4 pm will be answered the following business day.   You may also send [place] a message via [place]. We typically respond to MyChart messages within 1-2 business days.  For prescription refills, please ask your pharmacy to contact our office. Our fax number is [phone].  If you have an urgent issue when the clinic is closed that cannot wait until the next business day, you can page your doctor at the number below.    Please note that while we do our best to be available for urgent issues outside of office hours, we are not available 24/7.   If you have an urgent issue and are unable to reach [place], you may choose to seek medical care at your doctor's office, retail clinic, urgent care center, or emergency room.  If you have a medical emergency, please immediately call 911 or  go to the emergency department.  Pager Numbers  - Dr. Nehemiah Massed: [phone]  - Dr. Laurence Ferrari: [phone]  - Dr. Nicole Kindred: [phone]  In the event of inclement weather, please call our main line at [phone] for an update on the status of any delays or closures.  Dermatology Medication Tips: Please keep the boxes that topical medications come in in order to help keep track of the instructions about where and how to use these. Pharmacies typically print the medication instructions only on the boxes and not directly on the medication tubes.   If your medication is too expensive, please contact our office at [phone] option 4 or send [place] a message through [place].   We are unable to tell what your co-pay for medications will be in advance as this is different depending on your insurance coverage. However, we may be able to find a substitute medication at lower cost or fill out paperwork to get insurance to cover a needed medication.   If a prior authorization is required to get your medication covered by your insurance company, please allow [place] 1-2 business days to complete this process.  Drug prices often vary depending on where the prescription is filled and some pharmacies may offer cheaper prices.  The website www.goodrx.com contains coupons for medications through different pharmacies. The prices here do not account for what the cost may be with help from insurance (it may be cheaper with your insurance), but  the website can give you the price if you did not use any insurance.  - You can print the associated coupon and take it with your prescription to the pharmacy.  - You may also stop by our office during regular business hours and pick up a GoodRx coupon card.  - If you need your prescription sent electronically to a different pharmacy, notify our office through [facility] or by phone at [phone] option 4.     Si Usted Necesita Algo Despus de Su Visita  Tambin  puede enviarnos un mensaje a travs de Pharmacist, community. Por lo general respondemos a los mensajes de MyChart en el transcurso de 1 a 2 das hbiles.  Para renovar recetas, por favor pida a su farmacia que se ponga en contacto con nuestra oficina. Harland Dingwall de fax es [place] [phone].  Si tiene un asunto urgente cuando la clnica est cerrada y que no puede esperar hasta el siguiente da hbil, puede llamar/localizar a su doctor(a) al nmero que aparece a continuacin.   Por favor, tenga en cuenta que aunque hacemos todo lo posible para estar disponibles para asuntos urgentes fuera del horario de [place], no estamos disponibles las 24 horas del da, los 7 das de la [place].   Si tiene un problema urgente y no puede comunicarse con nosotros, puede optar por buscar atencin mdica  en el consultorio de su doctor(a), en una clnica privada, en un centro de atencin urgente o en una sala de emergencias.  Si tiene Engineering geologist, por favor llame inmediatamente al 911 o vaya a la sala de emergencias.  Nmeros de bper  - Dr. Nehemiah Massed: [phone]  - Dra. Moye: [phone]  - Dra. Nicole Kindred: [phone]  En caso de inclemencias del [place], por favor llame a Johnsie Kindred principal al [phone] para una actualizacin sobre el [place] de cualquier retraso o cierre.  Consejos para la medicacin en dermatologa: Por favor, guarde las cajas en las que vienen los medicamentos de uso tpico para ayudarle a seguir las instrucciones sobre dnde y cmo usarlos. Las farmacias generalmente imprimen las instrucciones del medicamento slo en las cajas y no directamente en los tubos del [place].   Si su medicamento es muy caro, por favor, pngase en contacto con Zigmund Daniel llamando al [phone] y presione la opcin 4 o envenos un mensaje a travs de Pharmacist, community.   No podemos decirle cul ser su copago por los medicamentos por adelantado ya que esto es diferente dependiendo de la cobertura de su  seguro. Sin embargo, es posible que podamos encontrar un medicamento sustituto a Electrical engineer un formulario para que el seguro cubra el medicamento que se considera necesario.   Si se requiere una autorizacin previa para que su compaa de seguros [place] su medicamento, por favor permtanos de 1 a 2 das hbiles para completar este proceso.  Los precios de los medicamentos varan con frecuencia dependiendo del Environmental consultant de dnde se surte la receta y alguna farmacias pueden ofrecer precios ms baratos.  El sitio web www.goodrx.com tiene cupones para medicamentos de Airline pilot. Los precios aqu no tienen en cuenta lo que podra costar con la ayuda del seguro (puede ser ms barato con su seguro), pero el sitio web puede darle el precio si no utiliz Research scientist (physical sciences).  - Puede imprimir el cupn correspondiente y llevarlo con su receta a la farmacia.  - Tambin puede pasar por nuestra oficina durante el horario de atencin regular y Charity fundraiser una tarjeta de cupones de  GoodRx.  - Si necesita que su receta se enve electrnicamente a una farmacia diferente, informe a nuestra oficina a travs de MyChart de [place] o por telfono llamando al [phone] y presione la opcin 4.

## 2021-11-24 NOTE — Progress Notes (Signed)
Follow-Up Visit   Subjective  Kim Shannon is a 26 y.o. female who presents for the following: Annual Exam (Hx dysplastic nevi ). The patient presents for Total-Body Skin Exam (TBSE) for skin cancer screening and mole check.  The patient has spots, moles and lesions to be evaluated, some may be new or changing.  The following portions of the chart were reviewed this encounter and updated as appropriate:   Tobacco  Allergies  Meds  Problems  Med Hx  Surg Hx  Fam Hx      Review of Systems:  No other skin or systemic complaints except as noted in HPI or Assessment and Plan.  Objective  Well appearing patient in no apparent distress; mood and affect are within normal limits.  A full examination was performed including scalp, head, eyes, ears, nose, lips, neck, chest, axillae, abdomen, back, buttocks, bilateral upper extremities, bilateral lower extremities, hands, feet, fingers, toes, fingernails, and toenails. All findings within normal limits unless otherwise noted below.  Face Trace open comedones and scattered inflamed papules of the face.     Assessment & Plan  Acne vulgaris Face  Chronic and persistent condition with duration or expected duration over one year. Condition is symptomatic/ bothersome to patient. Not currently at goal.  Patient currently breast feeding   Recommend Cln brand Acne or Sports wash. The [facility] Hypochlorous Spray can be sprayed on daily and left on. The Cln wash should be applied to the affected area daily for at least 30 seconds and then rinsed off. If you are using clindamycin solution or lotion or another topical antibiotic to treat acne, using a hypochlorous product may help lower the risk of antibiotic resistant bacteria.   Start Clindamycin solution apply to aa's QD-BID.  Once patient is no longer breast feeding would try adding [place] from [place].    clindamycin (CLEOCIN T) 1 % external solution - Face Apply to aa's  face QD-BID.   Lentigines - Scattered tan macules - Due to sun exposure - Benign-appearing, observe - Recommend daily broad spectrum sunscreen SPF 30+ to sun-exposed areas, reapply every 2 hours as needed. - Call for any changes  Seborrheic Keratoses - Stuck-on, waxy, tan-brown papules and/or plaques  - Benign-appearing - Discussed benign etiology and prognosis. - Observe - Call for any changes  Melanocytic Nevi - Tan-brown and/or pink-flesh-colored symmetric macules and papules - Benign appearing on exam today - Observation - Call clinic for new or changing moles - Recommend daily use of broad spectrum spf 30+ sunscreen to sun-exposed areas.   Hemangiomas - Red papules - Discussed benign nature - Observe - Call for any changes  Actinic Damage - Chronic condition, secondary to cumulative UV/sun exposure - diffuse scaly erythematous macules with underlying dyspigmentation - Recommend daily broad spectrum sunscreen SPF 30+ to sun-exposed areas, reapply every 2 hours as needed.  - Staying in the shade or wearing long sleeves, sun glasses (UVA+UVB protection) and wide brim hats (4-inch brim around the entire circumference of the hat) are also recommended for sun protection.  - Call for new or changing lesions.  History of Dysplastic Nevus - L side above waistline (mild) - No evidence of recurrence today - Recommend regular full body skin exams - Recommend daily broad spectrum sunscreen SPF 30+ to sun-exposed areas, reapply every 2 hours as needed.  - Call if any new or changing lesions are noted between office visits  Skin cancer screening performed today.  Return in about 1 year (around  11/25/2022) for TBSE.  Luther Redo, CMA, am acting as scribe for Forest Gleason, MD .  Documentation: I have reviewed the above documentation for accuracy and completeness, and I agree with the above.  Forest Gleason, MD

## 2021-12-04 ENCOUNTER — Encounter: Payer: Self-pay | Admitting: Dermatology

## 2022-01-19 ENCOUNTER — Encounter: Payer: BC Managed Care – PPO | Admitting: Physician Assistant

## 2022-02-13 ENCOUNTER — Encounter: Payer: BC Managed Care – PPO | Admitting: Physician Assistant

## 2022-05-25 ENCOUNTER — Encounter: Payer: BC Managed Care – PPO | Admitting: Physician Assistant

## 2022-06-05 DIAGNOSIS — J040 Acute laryngitis: Secondary | ICD-10-CM | POA: Diagnosis not present

## 2022-06-23 ENCOUNTER — Telehealth: Payer: Self-pay | Admitting: Physician Assistant

## 2022-06-23 NOTE — Telephone Encounter (Signed)
Left vm and sent mychart message to confirm 06/30/22 appointment-Toni

## 2022-06-30 ENCOUNTER — Encounter: Payer: Self-pay | Admitting: Physician Assistant

## 2022-06-30 ENCOUNTER — Ambulatory Visit (INDEPENDENT_AMBULATORY_CARE_PROVIDER_SITE_OTHER): Payer: BC Managed Care – PPO | Admitting: Physician Assistant

## 2022-06-30 VITALS — BP 114/67 | HR 62 | Temp 98.0°F | Resp 16 | Ht 66.0 in | Wt 162.0 lb

## 2022-06-30 DIAGNOSIS — E559 Vitamin D deficiency, unspecified: Secondary | ICD-10-CM

## 2022-06-30 DIAGNOSIS — R5383 Other fatigue: Secondary | ICD-10-CM | POA: Diagnosis not present

## 2022-06-30 DIAGNOSIS — R946 Abnormal results of thyroid function studies: Secondary | ICD-10-CM

## 2022-06-30 DIAGNOSIS — E611 Iron deficiency: Secondary | ICD-10-CM | POA: Diagnosis not present

## 2022-06-30 DIAGNOSIS — E782 Mixed hyperlipidemia: Secondary | ICD-10-CM

## 2022-06-30 DIAGNOSIS — E538 Deficiency of other specified B group vitamins: Secondary | ICD-10-CM

## 2022-06-30 DIAGNOSIS — Z0001 Encounter for general adult medical examination with abnormal findings: Secondary | ICD-10-CM

## 2022-06-30 DIAGNOSIS — R300 Dysuria: Secondary | ICD-10-CM

## 2022-06-30 DIAGNOSIS — Z01419 Encounter for gynecological examination (general) (routine) without abnormal findings: Secondary | ICD-10-CM

## 2022-06-30 NOTE — Progress Notes (Signed)
[facility] [place], [place] [address]  Internal MEDICINE  Office Visit Note  Patient Name: Kim Shannon  [id number]  [id number]  Date of Service: 07/05/2022  Chief Complaint  Patient presents with   Annual Exam   Anemia     HPI Pt is here for routine health maintenance examination -She had had her first baby, a girl, in July 2023 via c-section. She is breast feeding and has not resumed her cycle. -Going to the gym most days, states this is her time to herself -Only taking prenatal vitamin currently -She did have low iron and B12 while pregnant, will need to check labs  -Sleeping well, gets up once per night to feed baby -working full time for [employer] and baby is in daycare, but unfortunately this means they have both had several illnesses including RSV and laryngitis. Doing well now  Current Medication: Outpatient Encounter Medications as of 06/30/2022  Medication Sig   clindamycin (CLEOCIN T) 1 % external solution Apply to aa's face QD-BID.   Prenatal Vit-Fe Fumarate-FA (MULTIVITAMIN-PRENATAL) 27-0.8 MG TABS tablet Take 1 tablet by mouth daily at 12 noon.   [DISCONTINUED] ferrous sulfate 325 (65 FE) MG tablet Take 28 mg by mouth daily with breakfast.   [DISCONTINUED] acetaminophen (TYLENOL) 500 MG tablet Take 2 tablets (1,000 mg total) by mouth every 6 (six) hours.   [DISCONTINUED] coconut oil OIL Apply 1 Application topically as needed.   [DISCONTINUED] diphenhydrAMINE (BENADRYL) 25 mg capsule Take 1 capsule (25 mg total) by mouth every 6 (six) hours as needed for itching.   [DISCONTINUED] ibuprofen (ADVIL) 600 MG tablet Take 1 tablet (600 mg total) by mouth every 6 (six) hours.   [DISCONTINUED] senna-docusate (SENOKOT-S) 8.6-50 MG tablet Take 2 tablets by mouth daily.   [DISCONTINUED] simethicone (MYLICON) 80 MG chewable tablet Chew 1 tablet (80 mg total) by mouth 3 (three) times daily after meals.   [DISCONTINUED] vitamin B-12 (CYANOCOBALAMIN)  500 MCG tablet Take 1,000 mcg by mouth daily.   No facility-administered encounter medications on file as of 06/30/2022.    Surgical History: Past Surgical History:  Procedure Laterality Date   BREAST BIOPSY Left    CESAREAN SECTION  10/18/2021   Procedure: CESAREAN SECTION;  Surgeon: Will Bonnet, MD;  Location: [facility] ORS;  Service: Obstetrics;;   WISDOM TOOTH EXTRACTION      Medical History: Past Medical History:  Diagnosis Date   Anemia    Dysplastic nevus 03/24/2015   left side above waistline, mild atypia, lateral margin involved.    Family History: Family History  Problem Relation Age of Onset   Breast cancer Paternal Grandmother    Prostate cancer Paternal Grandfather    Diabetes Paternal Grandfather       Review of Systems  Constitutional:  Negative for chills, fatigue and unexpected weight change.  HENT:  Negative for congestion, postnasal drip, rhinorrhea, sneezing and sore throat.   Eyes:  Negative for redness.  Respiratory:  Negative for cough, chest tightness and shortness of breath.   Cardiovascular:  Negative for chest pain and palpitations.  Gastrointestinal:  Negative for abdominal pain, constipation, diarrhea, nausea and vomiting.  Genitourinary:  Negative for dysuria and frequency.  Musculoskeletal:  Negative for arthralgias, back pain, joint swelling and neck pain.  Skin:  Negative for rash.  Neurological: Negative.  Negative for tremors and numbness.  Hematological:  Negative for adenopathy. Does not bruise/bleed easily.  Psychiatric/Behavioral:  Negative for behavioral problems (Depression), sleep disturbance and suicidal ideas.  The patient is not nervous/anxious.      Vital Signs: BP 114/67   Pulse 62   Temp 98 F (36.7 C)   Resp 16   Ht 5\' 6"  (1.676 m)   Wt 162 lb (73.5 kg)   SpO2 99%   BMI 26.15 kg/m    Physical Exam Constitutional:      General: She is not in acute distress.    Appearance: She is well-developed. She is not  diaphoretic.  HENT:     Head: Normocephalic and atraumatic.     Mouth/Throat:     Pharynx: No oropharyngeal exudate.  Eyes:     Pupils: Pupils are equal, round, and reactive to light.  Neck:     Thyroid: No thyromegaly.     Vascular: No JVD.     Trachea: No tracheal deviation.  Cardiovascular:     Rate and Rhythm: Normal rate and regular rhythm.     Heart sounds: Normal heart sounds. No murmur heard.    No friction rub. No gallop.  Pulmonary:     Effort: Pulmonary effort is normal. No respiratory distress.     Breath sounds: No wheezing or rales.  Chest:     Chest wall: No tenderness.  Breasts:    Right: Normal. No mass.     Left: Normal. No mass.  Abdominal:     General: Bowel sounds are normal.     Palpations: Abdomen is soft.  Musculoskeletal:        General: Normal range of motion.     Cervical back: Normal range of motion and neck supple.  Lymphadenopathy:     Cervical: No cervical adenopathy.  Skin:    General: Skin is warm and dry.  Neurological:     Mental Status: She is alert and oriented to person, place, and time.     Cranial Nerves: No cranial nerve deficit.  Psychiatric:        Behavior: Behavior normal.        Thought Content: Thought content normal.        Judgment: Judgment normal.      LABS: Recent Results (from the past 2160 hour(s))  CBC w/Diff/Platelet     Status: Abnormal   Collection Time: 06/30/22 10:05 AM  Result Value Ref Range   WBC 7.8 3.4 - 10.8 x10E3/uL   RBC 4.80 3.77 - 5.28 x10E6/uL   Hemoglobin 13.6 11.1 - 15.9 g/dL   Hematocrit 42.1 34.0 - 46.6 %   MCV 88 79 - 97 fL   MCH 28.3 26.6 - 33.0 pg   MCHC 32.3 31.5 - 35.7 g/dL   RDW 12.2 11.7 - 15.4 %   Platelets 180 150 - 450 x10E3/uL   Neutrophils 48 Not Estab. %   Lymphs 42 Not Estab. %   Monocytes 8 Not Estab. %   Eos 1 Not Estab. %   Basos 1 Not Estab. %   Neutrophils Absolute 3.7 1.4 - 7.0 x10E3/uL   Lymphocytes Absolute 3.3 (H) 0.7 - 3.1 x10E3/uL   Monocytes Absolute  0.6 0.1 - 0.9 x10E3/uL   EOS (ABSOLUTE) 0.1 0.0 - 0.4 x10E3/uL   Basophils Absolute 0.1 0.0 - 0.2 x10E3/uL   Immature Granulocytes 0 Not Estab. %   Immature Grans (Abs) 0.0 0.0 - 0.1 x10E3/uL  Comprehensive metabolic panel     Status: Abnormal   Collection Time: 06/30/22 10:05 AM  Result Value Ref Range   Glucose 75 70 - 99 mg/dL   BUN 11 6 -  20 mg/dL   Creatinine, Ser 0.67 0.57 - 1.00 mg/dL   eGFR 121 >59 mL/min/1.73   BUN/Creatinine Ratio 16 9 - 23   Sodium 140 134 - 144 mmol/L   Potassium 4.9 3.5 - 5.2 mmol/L   Chloride 102 96 - 106 mmol/L   CO2 24 20 - 29 mmol/L   Calcium 9.4 8.7 - 10.2 mg/dL   Total Protein 6.7 6.0 - 8.5 g/dL   Albumin 4.5 4.0 - 5.0 g/dL   Globulin, Total 2.2 1.5 - 4.5 g/dL   Albumin/Globulin Ratio 2.0 1.2 - 2.2   Bilirubin Total 0.6 0.0 - 1.2 mg/dL   Alkaline Phosphatase 88 44 - 121 IU/L   AST 32 0 - 40 IU/L   ALT 35 (H) 0 - 32 IU/L  TSH + free T4     Status: None   Collection Time: 06/30/22 10:05 AM  Result Value Ref Range   TSH 1.180 0.450 - 4.500 uIU/mL   Free T4 1.21 0.82 - 1.77 ng/dL  Lipid Panel With LDL/HDL Ratio     Status: Abnormal   Collection Time: 06/30/22 10:05 AM  Result Value Ref Range   Cholesterol, Total 179 100 - 199 mg/dL   Triglycerides 37 0 - 149 mg/dL   HDL 66 >39 mg/dL   VLDL Cholesterol Cal 8 5 - 40 mg/dL   LDL Chol Calc (NIH) 105 (H) 0 - 99 mg/dL   LDL/HDL Ratio 1.6 0.0 - 3.2 ratio    Comment:                                     LDL/HDL Ratio                                             Men  Women                               1/2 Avg.Risk  1.0    1.5                                   Avg.Risk  3.6    3.2                                2X Avg.Risk  6.2    5.0                                3X Avg.Risk  8.0    6.1   Fe+TIBC+Fer     Status: None (Preliminary result)   Collection Time: 06/30/22 10:05 AM  Result Value Ref Range   Total Iron Binding Capacity WILL FOLLOW    UIBC WILL FOLLOW    Iron 110 27 - 159 ug/dL   Iron  Saturation WILL FOLLOW    Ferritin 103 15 - 150 ng/mL  B12 and Folate Panel     Status: None   Collection Time: 06/30/22 10:05 AM  Result Value Ref Range   Vitamin B-12 784 232 - 1,245 pg/mL   Folate >20.0 >3.0 ng/mL  Comment: A serum folate concentration of less than 3.1 ng/mL is considered to represent clinical deficiency.   VITAMIN D 25 Hydroxy (Vit-D Deficiency, Fractures)     Status: None   Collection Time: 06/30/22 10:05 AM  Result Value Ref Range   Vit D, 25-Hydroxy 42.6 30.0 - 100.0 ng/mL    Comment: Vitamin D deficiency has been defined by the [place] practice guideline as a level of serum 25-OH vitamin D less than 20 ng/mL (1,2). The Endocrine Society went on to further define vitamin D insufficiency as a level between 21 and 29 ng/mL (2). 1. IOM (Institute of Medicine). 2010. Dietary reference    intakes for calcium and D. [place]: The    [employer]. 2. Holick MF, Binkley Franklin, Bischoff-Ferrari HA, et al.    Evaluation, treatment, and prevention of vitamin D    deficiency: an Endocrine Society clinical practice    guideline. JCEM. 2011 Jul; 96(7):1911-30.         Assessment/Plan: 1. Encounter for general adult medical examination with abnormal findings CPE performed, will order routine fasting labs - CBC w/Diff/Platelet - Comprehensive metabolic panel - TSH + free T4 - Lipid Panel With LDL/HDL Ratio - Fe+TIBC+Fer - B12 and Folate Panel - VITAMIN D 25 Hydroxy (Vit-D Deficiency, Fractures)  2. Vitamin B 12 deficiency - B12 and Folate Panel  3. Iron deficiency - Fe+TIBC+Fer  4. Vitamin D deficiency - VITAMIN D 25 Hydroxy (Vit-D Deficiency, Fractures)  5. Mixed hyperlipidemia - Lipid Panel With LDL/HDL Ratio  6. Abnormal thyroid exam - TSH + free T4  7. Other fatigue - CBC w/Diff/Platelet - Comprehensive metabolic panel - TSH + free T4 - Lipid Panel With LDL/HDL Ratio - Fe+TIBC+Fer - B12  and Folate Panel - VITAMIN D 25 Hydroxy (Vit-D Deficiency, Fractures)  8. Visit for gynecologic examination Breast exam performed  9. Dysuria - UA/M w/rflx Culture, Routine   General Counseling: Zoye verbalizes understanding of the findings of todays visit and agrees with plan of treatment. I have discussed any further diagnostic evaluation that may be needed or ordered today. We also reviewed her medications today. she has been encouraged to call the office with any questions or concerns that should arise related to todays visit.    Counseling:    Orders Placed This Encounter  Procedures   UA/M w/rflx Culture, Routine   CBC w/Diff/Platelet   Comprehensive metabolic panel   TSH + free T4   Lipid Panel With LDL/HDL Ratio   Fe+TIBC+Fer   B12 and Folate Panel   VITAMIN D 25 Hydroxy (Vit-D Deficiency, Fractures)    No orders of the defined types were placed in this encounter.   This patient was seen by Drema Dallas, PA-C in collaboration with Dr. Clayborn Bigness as a part of collaborative care agreement.  Total time spent:35 Minutes  Time spent includes review of chart, medications, test results, and follow up plan with the patient.     Lavera Guise, MD  Internal Medicine

## 2022-07-07 LAB — CBC WITH DIFFERENTIAL/PLATELET
Basophils Absolute: 0.1 10*3/uL (ref 0.0–0.2)
Basos: 1 %
EOS (ABSOLUTE): 0.1 10*3/uL (ref 0.0–0.4)
Eos: 1 %
Hematocrit: 42.1 % (ref 34.0–46.6)
Hemoglobin: 13.6 g/dL (ref 11.1–15.9)
Immature Grans (Abs): 0 10*3/uL (ref 0.0–0.1)
Immature Granulocytes: 0 %
Lymphocytes Absolute: 3.3 10*3/uL — ABNORMAL HIGH (ref 0.7–3.1)
Lymphs: 42 %
MCH: 28.3 pg (ref 26.6–33.0)
MCHC: 32.3 g/dL (ref 31.5–35.7)
MCV: 88 fL (ref 79–97)
Monocytes Absolute: 0.6 10*3/uL (ref 0.1–0.9)
Monocytes: 8 %
Neutrophils Absolute: 3.7 10*3/uL (ref 1.4–7.0)
Neutrophils: 48 %
Platelets: 180 10*3/uL (ref 150–450)
RBC: 4.8 x10E6/uL (ref 3.77–5.28)
RDW: 12.2 % (ref 11.7–15.4)
WBC: 7.8 10*3/uL (ref 3.4–10.8)

## 2022-07-07 LAB — COMPREHENSIVE METABOLIC PANEL
ALT: 35 IU/L — ABNORMAL HIGH (ref 0–32)
AST: 32 IU/L (ref 0–40)
Albumin/Globulin Ratio: 2 (ref 1.2–2.2)
Albumin: 4.5 g/dL (ref 4.0–5.0)
Alkaline Phosphatase: 88 IU/L (ref 44–121)
BUN/Creatinine Ratio: 16 (ref 9–23)
BUN: 11 mg/dL (ref 6–20)
Bilirubin Total: 0.6 mg/dL (ref 0.0–1.2)
CO2: 24 mmol/L (ref 20–29)
Calcium: 9.4 mg/dL (ref 8.7–10.2)
Chloride: 102 mmol/L (ref 96–106)
Creatinine, Ser: 0.67 mg/dL (ref 0.57–1.00)
Globulin, Total: 2.2 g/dL (ref 1.5–4.5)
Glucose: 75 mg/dL (ref 70–99)
Potassium: 4.9 mmol/L (ref 3.5–5.2)
Sodium: 140 mmol/L (ref 134–144)
Total Protein: 6.7 g/dL (ref 6.0–8.5)
eGFR: 121 mL/min/{1.73_m2} (ref >59)

## 2022-07-07 LAB — TSH+FREE T4
Free T4: 1.21 ng/dL (ref 0.82–1.77)
TSH: 1.18 u[IU]/mL (ref 0.450–4.500)

## 2022-07-07 LAB — LIPID PANEL WITH LDL/HDL RATIO
Cholesterol, Total: 179 mg/dL (ref 100–199)
HDL: 66 mg/dL (ref >39)
LDL Chol Calc (NIH): 105 mg/dL — ABNORMAL HIGH (ref 0–99)
LDL/HDL Ratio: 1.6 ratio (ref 0.0–3.2)
Triglycerides: 37 mg/dL (ref 0–149)
VLDL Cholesterol Cal: 8 mg/dL (ref 5–40)

## 2022-07-07 LAB — IRON,TIBC AND FERRITIN PANEL
Ferritin: 103 ng/mL (ref 15–150)
Iron Saturation: 37 % (ref 15–55)
Iron: 110 ug/dL (ref 27–159)
Total Iron Binding Capacity: 300 ug/dL (ref 250–450)
UIBC: 190 ug/dL (ref 131–425)

## 2022-07-07 LAB — B12 AND FOLATE PANEL
Folate: >20 ng/mL (ref >3.0)
Vitamin B-12: 784 pg/mL (ref 232–1245)

## 2022-07-07 LAB — VITAMIN D 25 HYDROXY (VIT D DEFICIENCY, FRACTURES): Vit D, 25-Hydroxy: 42.6 ng/mL (ref 30.0–100.0)

## 2022-07-11 ENCOUNTER — Telehealth: Payer: Self-pay

## 2022-07-11 NOTE — Telephone Encounter (Signed)
Left message for patient regarding normal lab results. 

## 2022-07-11 NOTE — Telephone Encounter (Signed)
-----   Message from Mylinda Latina, PA-C sent at 07/11/2022  1:09 PM EDT ----- Please let her know that overall her labs look good. One of her WBC was a little elevated but overall count normal. One of her liver enzymes was also right at borderline but everything else looks good. Her total cholesterol is improved, but her LDL is still slightly elevated and continue to work on diet/exercise. Her thyroid, iron, B12, vit D all looked good.

## 2022-12-13 DIAGNOSIS — N912 Amenorrhea, unspecified: Secondary | ICD-10-CM | POA: Diagnosis not present

## 2022-12-13 DIAGNOSIS — Z3201 Encounter for pregnancy test, result positive: Secondary | ICD-10-CM | POA: Diagnosis not present

## 2022-12-21 ENCOUNTER — Encounter: Payer: BC Managed Care – PPO | Admitting: Dermatology

## 2022-12-28 ENCOUNTER — Ambulatory Visit (INDEPENDENT_AMBULATORY_CARE_PROVIDER_SITE_OTHER): Payer: BC Managed Care – PPO | Admitting: Dermatology

## 2022-12-28 ENCOUNTER — Encounter: Payer: Self-pay | Admitting: Dermatology

## 2022-12-28 DIAGNOSIS — L814 Other melanin hyperpigmentation: Secondary | ICD-10-CM

## 2022-12-28 DIAGNOSIS — Z331 Pregnant state, incidental: Secondary | ICD-10-CM

## 2022-12-28 DIAGNOSIS — W908XXA Exposure to other nonionizing radiation, initial encounter: Secondary | ICD-10-CM

## 2022-12-28 DIAGNOSIS — Z1283 Encounter for screening for malignant neoplasm of skin: Secondary | ICD-10-CM | POA: Diagnosis not present

## 2022-12-28 DIAGNOSIS — L578 Other skin changes due to chronic exposure to nonionizing radiation: Secondary | ICD-10-CM

## 2022-12-28 DIAGNOSIS — L739 Follicular disorder, unspecified: Secondary | ICD-10-CM

## 2022-12-28 DIAGNOSIS — L821 Other seborrheic keratosis: Secondary | ICD-10-CM

## 2022-12-28 DIAGNOSIS — D1801 Hemangioma of skin and subcutaneous tissue: Secondary | ICD-10-CM | POA: Diagnosis not present

## 2022-12-28 DIAGNOSIS — Z86018 Personal history of other benign neoplasm: Secondary | ICD-10-CM

## 2022-12-28 DIAGNOSIS — D229 Melanocytic nevi, unspecified: Secondary | ICD-10-CM

## 2022-12-28 NOTE — Progress Notes (Signed)
   Follow-Up Visit   Subjective  Kim Shannon is a 27 y.o. female who presents for the following: Skin Cancer Screening and Full Body Skin Exam  The patient presents for Total-Body Skin Exam (TBSE) for skin cancer screening and mole check. The patient has spots, moles and lesions to be evaluated, some may be new or changing and the patient may have concern these could be cancer.  Hx Dysplastic Nevus. Patient [redacted] weeks pregnant.   The following portions of the chart were reviewed this encounter and updated as appropriate: medications, allergies, medical history  Review of Systems:  No other skin or systemic complaints except as noted in HPI or Assessment and Plan.  Objective  Well appearing patient in no apparent distress; mood and affect are within normal limits.  A full examination was performed including scalp, head, eyes, ears, nose, lips, neck, chest, axillae, abdomen, back, buttocks, bilateral upper extremities, bilateral lower extremities, hands, feet, fingers, toes, fingernails, and toenails. All findings within normal limits unless otherwise noted below.   Exam of nails limited by presence of nail polish.  Relevant physical exam findings are noted in the Assessment and Plan.    Assessment & Plan   SKIN CANCER SCREENING PERFORMED TODAY.  ACTINIC DAMAGE - Chronic condition, secondary to cumulative UV/sun exposure - diffuse scaly erythematous macules with underlying dyspigmentation - Recommend daily broad spectrum sunscreen SPF 30+ to sun-exposed areas, reapply every 2 hours as needed.  - Staying in the shade or wearing long sleeves, sun glasses (UVA+UVB protection) and wide brim hats (4-inch brim around the entire circumference of the hat) are also recommended for sun protection.  - Call for new or changing lesions.  LENTIGINES, SEBORRHEIC KERATOSES, HEMANGIOMAS - Benign normal skin lesions - Benign-appearing - Call for any changes  MELANOCYTIC NEVI - Tan-brown  and/or pink-flesh-colored symmetric macules and papules - Benign appearing on exam today - Observation - Call clinic for new or changing moles - Recommend daily use of broad spectrum spf 30+ sunscreen to sun-exposed areas.   History of Dysplastic Nevi - No evidence of recurrence today at left side above waistline, mild atypia 2016 - Recommend regular full body skin exams - Recommend daily broad spectrum sunscreen SPF 30+ to sun-exposed areas, reapply every 2 hours as needed.  - Call if any new or changing lesions are noted between office visits  FOLLICULITIS Exam: follicular erythematous papules at legs and chest  Folliculitis occurs due to inflammation of the superficial hair follicle (pore), resulting in acne-like lesions (pus bumps). It can be infectious (bacterial, fungal) or noninfectious (shaving, tight clothing, heat/sweat, medications).  Folliculitis can be acute or chronic and recommended treatment depends on the underlying cause of folliculitis.  Treatment Plan: No treatment Flares with pregnancy per patient report   Return for TBSE, Hx Dysplastic Nevi, with Dr. Katrinka Blazing 1-2 years.  Anise Salvo, RMA, am acting as scribe for Elie Goody, MD .   Documentation: I have reviewed the above documentation for accuracy and completeness, and I agree with the above.  Elie Goody, MD

## 2022-12-28 NOTE — Patient Instructions (Signed)

## 2023-01-10 DIAGNOSIS — Z131 Encounter for screening for diabetes mellitus: Secondary | ICD-10-CM | POA: Diagnosis not present

## 2023-01-10 DIAGNOSIS — O34219 Maternal care for unspecified type scar from previous cesarean delivery: Secondary | ICD-10-CM | POA: Diagnosis not present

## 2023-01-10 DIAGNOSIS — Z3401 Encounter for supervision of normal first pregnancy, first trimester: Secondary | ICD-10-CM | POA: Diagnosis not present

## 2023-01-10 LAB — OB RESULTS CONSOLE HEPATITIS B SURFACE ANTIGEN: Hepatitis B Surface Ag: NEGATIVE

## 2023-01-10 LAB — OB RESULTS CONSOLE VARICELLA ZOSTER ANTIBODY, IGG: Varicella: IMMUNE

## 2023-01-10 LAB — OB RESULTS CONSOLE RUBELLA ANTIBODY, IGM: Rubella: IMMUNE

## 2023-01-16 ENCOUNTER — Encounter: Payer: Self-pay | Admitting: Nurse Practitioner

## 2023-01-16 ENCOUNTER — Ambulatory Visit (INDEPENDENT_AMBULATORY_CARE_PROVIDER_SITE_OTHER): Payer: BC Managed Care – PPO | Admitting: Nurse Practitioner

## 2023-01-16 VITALS — BP 104/68 | HR 61 | Temp 98.4°F | Resp 16 | Ht 66.0 in | Wt 165.0 lb

## 2023-01-16 DIAGNOSIS — R9230 Dense breasts, unspecified: Secondary | ICD-10-CM

## 2023-01-16 DIAGNOSIS — Z1239 Encounter for other screening for malignant neoplasm of breast: Secondary | ICD-10-CM

## 2023-01-16 NOTE — Progress Notes (Signed)
[facility] [address] [place], [place] [address]  Internal MEDICINE  Office Visit Note  Patient Name: Kim Shannon  [id number]  [id number]  Date of Service: 01/16/2023  Chief Complaint  Patient presents with   Acute Visit    Breast exam, dense tissue      HPI Aldine presents for an acute sick visit for concern about dense breast tissue Previously had a biopsy on the left breast Stopped breastfeeding one month ago. Currently about [redacted] weeks pregnant Breast is sore only on left side, not sure what her baseline is, last breast exam was while she was still breastfeeding.       Current Medication:  Outpatient Encounter Medications as of 01/16/2023  Medication Sig   clindamycin (CLEOCIN T) 1 % external solution Apply to aa's face QD-BID.   Prenatal Vit-Fe Fumarate-FA (MULTIVITAMIN-PRENATAL) 27-0.8 MG TABS tablet Take 1 tablet by mouth daily at 12 noon.   No facility-administered encounter medications on file as of 01/16/2023.      Medical History: Past Medical History:  Diagnosis Date   Anemia    Dysplastic nevus 03/24/2015   left side above waistline, mild atypia, lateral margin involved.     Vital Signs: BP 104/68   Pulse 61   Temp 98.4 F (36.9 C)   Resp 16   Ht 5\' 6"  (1.676 m)   Wt 165 lb (74.8 kg)   SpO2 98%   BMI 26.63 kg/m    Review of Systems  Constitutional:  Negative for fatigue.  Respiratory: Negative.  Negative for cough, chest tightness, shortness of breath and wheezing.   Cardiovascular: Negative.  Negative for chest pain and palpitations.  Musculoskeletal: Negative.  Negative for arthralgias and back pain.    Physical Exam Vitals reviewed.  Constitutional:      General: She is not in acute distress.    Appearance: Normal appearance. She is normal weight. She is not ill-appearing.  HENT:     Head: Normocephalic and atraumatic.  Eyes:     Pupils: Pupils are equal, round, and reactive to light.  Cardiovascular:      Rate and Rhythm: Normal rate and regular rhythm.  Chest:  Breasts:    Breasts are symmetrical.     Right: Normal. No swelling, bleeding, inverted nipple, mass, nipple discharge, skin change or tenderness.     Left: Normal. No swelling, bleeding, inverted nipple, mass, nipple discharge, skin change or tenderness.     Comments: At [redacted] weeks gestation, patient does have some soreness which is most likely from hormones, has some fibrocystic nodules and dense tissue as seen on prior imaging but otherwise normal exam.  Lymphadenopathy:     Upper Body:     Right upper body: No supraclavicular, axillary or pectoral adenopathy.     Left upper body: No supraclavicular, axillary or pectoral adenopathy.  Neurological:     Mental Status: She is alert and oriented to person, place, and time.  Psychiatric:        Mood and Affect: Mood normal.        Behavior: Behavior normal.       Assessment/Plan: 1. Dense breast tissue Found on mammogram, breast exam was normal  2. Encounter for screening breast examination Clinical breast exam performed, no suspicious findings.   General Counseling: Noelie verbalizes understanding of the findings of todays visit and agrees with plan of treatment. I have discussed any further diagnostic evaluation that may be needed or ordered today. We also reviewed her medications  today. she has been encouraged to call the office with any questions or concerns that should arise related to todays visit.    Counseling:    No orders of the defined types were placed in this encounter.   No orders of the defined types were placed in this encounter.   Return if symptoms worsen or fail to improve.  [place] Controlled Substance Database was reviewed by me for overdose risk score (ORS)  Time spent:20 Minutes Time spent with patient included reviewing progress notes, labs, imaging studies, and discussing plan for follow up.   This patient was seen by Sallyanne Kuster, FNP-C in  collaboration with Dr. Beverely Risen as a part of collaborative care agreement.  Ivis Nicolson R. Tedd Sias, MSN, FNP-C Internal Medicine

## 2023-02-07 DIAGNOSIS — Z3401 Encounter for supervision of normal first pregnancy, first trimester: Secondary | ICD-10-CM | POA: Diagnosis not present

## 2023-03-20 DIAGNOSIS — Z3401 Encounter for supervision of normal first pregnancy, first trimester: Secondary | ICD-10-CM | POA: Diagnosis not present

## 2023-03-20 DIAGNOSIS — Z3689 Encounter for other specified antenatal screening: Secondary | ICD-10-CM | POA: Diagnosis not present

## 2023-05-01 DIAGNOSIS — Z114 Encounter for screening for human immunodeficiency virus [HIV]: Secondary | ICD-10-CM | POA: Diagnosis not present

## 2023-05-01 DIAGNOSIS — Z3482 Encounter for supervision of other normal pregnancy, second trimester: Secondary | ICD-10-CM | POA: Diagnosis not present

## 2023-05-01 LAB — OB RESULTS CONSOLE RPR: RPR: NONREACTIVE

## 2023-05-01 LAB — OB RESULTS CONSOLE HIV ANTIBODY (ROUTINE TESTING): HIV: NONREACTIVE

## 2023-05-03 DIAGNOSIS — Z23 Encounter for immunization: Secondary | ICD-10-CM | POA: Diagnosis not present

## 2023-06-07 DIAGNOSIS — D696 Thrombocytopenia, unspecified: Secondary | ICD-10-CM | POA: Diagnosis not present

## 2023-06-07 DIAGNOSIS — O99119 Other diseases of the blood and blood-forming organs and certain disorders involving the immune mechanism complicating pregnancy, unspecified trimester: Secondary | ICD-10-CM | POA: Diagnosis not present

## 2023-06-21 DIAGNOSIS — D696 Thrombocytopenia, unspecified: Secondary | ICD-10-CM | POA: Diagnosis not present

## 2023-06-21 DIAGNOSIS — O99119 Other diseases of the blood and blood-forming organs and certain disorders involving the immune mechanism complicating pregnancy, unspecified trimester: Secondary | ICD-10-CM | POA: Diagnosis not present

## 2023-06-21 DIAGNOSIS — O34219 Maternal care for unspecified type scar from previous cesarean delivery: Secondary | ICD-10-CM | POA: Diagnosis not present

## 2023-06-21 DIAGNOSIS — O26849 Uterine size-date discrepancy, unspecified trimester: Secondary | ICD-10-CM | POA: Diagnosis not present

## 2023-06-22 NOTE — H&P (Signed)
 OB History & Physical   History of Present Illness:  Chief Complaint: Pre-operative visit for cesarean delivery  HPI:  Kim Shannon is a 28 y.o. G77P1001 female at [redacted]w[redacted]d dated by LMP consistent with 8 week ultrsaound.  Her pregnancy has been complicated by history of cesarean section, thrombocytopenia.    She denies contractions.   She denies leakage of fluid.   She denies vaginal bleeding.   She reports fetal movement.    Total weight gain for pregnancy: 12.2 kg (27 lb)   Obstetrical Problem List: Pregnancy Problems (from 01/04/23 to present)     Problem Noted Diagnosed Resolved   Supervision of high risk pregnancy in third trimester (HHS-HCC) 12/13/2022 by Kathaleen Grinder, CNM  No   Overview Addendum 06/21/2023  2:33 PM by Ceasar Mons, CMA    28 y.o. G2P1001 at [redacted]w[redacted]d No LMP recorded. Patient is pregnant. inconsistent with  with ultrasound @ [redacted]w[redacted]d.Estimated Date of Delivery: 07/25/23 Sex of baby and name:  baby boy" "   Partner: Kim Shannon     Factors complicating this pregnancy  Hx of cesarean section x1 Indication: NRFHR Dr. Jean Rosenthal 10/18/21 Op note reviewed on 10/18/21 by DVS- no significant findings TOLAC calculator- 68.1% Needs TOLAC counseling Delivery preference: if goes into labor on own by 39 weeks may try TOLAC, but otherwise wants a repeat c/s. Scheduled for 07/18/2023 with SDJ/AM Thrombocytopenia Plt 197 on 01/10/23 > 143 (05/01/23) 06/07/2023 - Platelets 145  History of covid in pregnancy  First trimester  Growth [place] 34-36 weeks  Weekly NST at 36 weeks  Uterine Size < Dates  Growth [place] ordered for 35 weeks 06/21/23  Screening results and needs: NOB:  Medicaid Questionnaire:  Depression Score: MBT:  A +                        Ab screen:  Negative  HIV:  Negative                 RPR:   NR Hep B: negative               Hep C: NR Pap: 02/03/2021 NILM    G/C: neg/neg Rubella:  Immune             VZV: Immune TSH: 1.180                      HbA1C: 5.3 Aneuploidy:  First  trimester: MaternitT21: negative  Second trimester (AFP/tetra): neg 28 weeks:  Review Medicaid Questionnaire: []  ACHD Program Depression Score: 1 Blood consent:   signed 05/03/23 AMM                 Hgb:   Platelets:    Glucola:   RPR:   HIV: 36 weeks:  GBS:   G/C:   Hgb:  Platelets:    Last [place]:  12/13/2022: Single IUP seen; CRL = 16.5 mm = [redacted]w[redacted]d; EDC by 07/25/2023; FHT: 174 bpm; Cervical length: normal, no funneling; Right ovary: WNL; Left ovary: WNL 03/20/23: Single, live IUP, no obvious anomalies seen, biometry consistent consistent with GA, EFW = 499g (1lb 2oz) 70%, FHR = 152bpm, Presentation variable- breech to transverse, Placenta posterior, Cervical length = 3.63cm, B/L ovaries appear normal, no evidence of placenta previa. 06/21/23: Cardiac activity Present. FHR 149 bpm. Presentation: Cephalic, Placenta: Placental site: Posterior. It has been documented in a previous report that there is no evidence of placenta previa. Umbilical cord:3 vessel cord Amniotic  fluid: Amount of AF: Normal. MVP 4.4 cm. AFI 14.4 cm Immunization:   Flu in season - declined  Tdap at 27-36 weeks - Given 05/03/23 TD RSV at 32-36 weeks -  Contraception Plan: LAM - maybe condoms or IUD  Feeding Plan: Breast  Labor Plans:           Maternal Medical History:   Past Medical History:  Diagnosis Date   Acne vulgaris    Anemia    Bilateral plantar fasciitis 2018   Breast mass, left 01/24/2019   biopsy= benign   Dysplastic nevus 03/24/2015   left side above waistline, mild atypia, lateral margin involved.   Family history of breast cancer    PGM   Folliculitis 2021   History of irregular menstrual bleeding 10/16/2017   Vitamin D deficiency     Past Surgical History:  Procedure Laterality Date   BREAST BIOPSY Left 02/19/2019   benign   CESAREAN SECTION  10/18/2021   Dr. Thomasene Mohair   EXTRACTION TEETH     wisdom teeth    No Known Allergies  Prior to Admission medications   Medication Sig  Taking? Last Dose  cyanocobalamin, vitamin B-12, (VITAMIN B-12 ORAL) Take by mouth Yes Taking  ferrous sulfate (IRON ORAL) Take by mouth Yes Taking  prenatal vitamin with iron-folic acid (PRENATAL TABLETS) tablet Take 1 tablet by mouth once daily Yes Taking    OB History  Gravida Para Term Preterm AB Living  2 1 1     1   SAB IAB Ectopic Molar Multiple Live Births            1    # Outcome Date GA Lbr Len/2nd Weight Sex Type Anes PTL Lv  2 Current           1 Term 10/18/21 [redacted]w[redacted]d  3.46 kg (7 lb 10.1 oz) F CS-LTranv EPI  LIV    Prenatal care site: [facility]  Social History: She  reports that she has never smoked. She has never used smokeless tobacco. She reports that she does not currently use alcohol after a past usage of about 3.0 standard drinks of alcohol per week. She reports that she does not use drugs.  Family History: family history includes Breast cancer in her paternal grandmother; Diabetes in her paternal grandfather; High blood pressure (Hypertension) in her paternal grandfather; Prostate cancer in her paternal grandfather.    Review of Systems:  Review of Systems  Constitutional: Negative.   HENT: Negative.    Eyes: Negative.   Respiratory: Negative.    Cardiovascular: Negative.   Gastrointestinal: Negative.   Genitourinary: Negative.   Musculoskeletal: Negative.   Skin: Negative.   Neurological: Negative.   Endo/Heme/Allergies: Negative.   Psychiatric/Behavioral: Negative.       Physical Exam:  Ht 167.6 cm (5\' 6" )   LMP 10/18/2022 (Exact Date)   BMI 30.18 kg/m   Physical Exam Constitutional:      General: She is not in acute distress.    Appearance: Normal appearance.  HENT:     Head: Normocephalic and atraumatic.  Eyes:     General: No scleral icterus.    Conjunctiva/sclera: Conjunctivae normal.  Cardiovascular:     Rate and Rhythm: Normal rate and regular rhythm.     Heart sounds: No murmur heard.    No friction rub. No gallop.  Pulmonary:      Effort: Pulmonary effort is normal. No respiratory distress.     Breath sounds: Normal breath sounds. No wheezing,  rhonchi or rales.  Abdominal:     General: Bowel sounds are normal. There is no distension.     Palpations: Abdomen is soft. There is mass (gravid, NT).     Tenderness: There is no abdominal tenderness. There is no guarding or rebound.  Musculoskeletal:        General: No swelling. Normal range of motion.  Neurological:     General: No focal deficit present.     Mental Status: She is oriented to person, place, and time.     Cranial Nerves: No cranial nerve deficit.  Skin:    General: Skin is warm and dry.     Findings: No lesion.  Psychiatric:        Mood and Affect: Mood normal.        Behavior: Behavior normal.        Judgment: Judgment normal.  Vitals and nursing note reviewed.      Pertinent Results:  Prenatal Labs Blood type/Rh A +  Antibody screen negative  Rubella Immune  Varicella Immune    RPR NR  HBsAg negative  HIV negative  Hep C negative  GC negative  Chlamydia negative  Genetic screening NIPS diploid  1 hour GTT 58  3 hour GTT N/a  GBS unknown  ]  Assessment:  Manuelita Moxon is a 28 y.o. G52P1001 female at [redacted]w[redacted]d with history of c-section, desires repeat.   Plan:  Admit to Labor & Delivery  CBC, T&S, Clrs, IVF GBS unknown, to be collected next week.   To OR for repeat c-section Consents reviewed and signed today.    Rubye Oaks, MD 06/22/2023 5:46 PM

## 2023-06-29 DIAGNOSIS — O34219 Maternal care for unspecified type scar from previous cesarean delivery: Secondary | ICD-10-CM | POA: Diagnosis not present

## 2023-06-29 DIAGNOSIS — O0993 Supervision of high risk pregnancy, unspecified, third trimester: Secondary | ICD-10-CM | POA: Diagnosis not present

## 2023-06-29 DIAGNOSIS — Z3A36 36 weeks gestation of pregnancy: Secondary | ICD-10-CM | POA: Diagnosis not present

## 2023-06-29 LAB — OB RESULTS CONSOLE GC/CHLAMYDIA
Chlamydia: NEGATIVE
Neisseria Gonorrhea: NEGATIVE

## 2023-06-29 LAB — OB RESULTS CONSOLE GBS: GBS: NEGATIVE

## 2023-07-04 DIAGNOSIS — Z3A37 37 weeks gestation of pregnancy: Secondary | ICD-10-CM | POA: Diagnosis not present

## 2023-07-04 DIAGNOSIS — O98519 Other viral diseases complicating pregnancy, unspecified trimester: Secondary | ICD-10-CM | POA: Diagnosis not present

## 2023-07-04 DIAGNOSIS — O0993 Supervision of high risk pregnancy, unspecified, third trimester: Secondary | ICD-10-CM | POA: Diagnosis not present

## 2023-07-04 DIAGNOSIS — U071 COVID-19: Secondary | ICD-10-CM | POA: Diagnosis not present

## 2023-07-05 ENCOUNTER — Encounter: Payer: BC Managed Care – PPO | Admitting: Physician Assistant

## 2023-07-10 DIAGNOSIS — Z3A37 37 weeks gestation of pregnancy: Secondary | ICD-10-CM | POA: Diagnosis not present

## 2023-07-10 DIAGNOSIS — O0993 Supervision of high risk pregnancy, unspecified, third trimester: Secondary | ICD-10-CM | POA: Diagnosis not present

## 2023-07-13 ENCOUNTER — Encounter
Admission: RE | Admit: 2023-07-13 | Discharge: 2023-07-13 | Disposition: A | Discharge status: Home / Self Care | Payer: BC Managed Care – PPO | Source: Ambulatory Visit | Attending: Obstetrics and Gynecology | Admitting: Obstetrics and Gynecology

## 2023-07-13 ENCOUNTER — Other Ambulatory Visit: Payer: Self-pay

## 2023-07-13 HISTORY — DX: Other specified postprocedural states: Z98.890

## 2023-07-13 HISTORY — DX: Nausea with vomiting, unspecified: R11.2

## 2023-07-13 NOTE — Patient Instructions (Addendum)
 Your procedure is scheduled on: [facility] 07/18/23    Report to the Registration Desk on the 1st floor of the [facility] by 7:45 am FREE Valet parking is available. For questions call the [facility] at [phone]  If your arrival time is 6:00 am, do not arrive before that time as the [facility] entrance doors do not open until 6:00 am.  REMEMBER: Instructions that are not followed completely may result in serious medical risk, up to and including death; or upon the discretion of your surgeon and anesthesiologist your surgery may need to be rescheduled.  Do not eat food or drink any liquids after midnight the night before surgery.  No gum chewing or hard candies.  Continue taking all prescribed medications.   TAKE ONLY THESE MEDICATIONS THE MORNING OF SURGERY WITH A SIP OF WATER:  none  No Alcohol for 24 hours before or after surgery.  No Smoking including e-cigarettes for 24 hours before surgery.  No chewable tobacco products for at least 6 hours before surgery.  No nicotine patches on the day of surgery.  Do not use any "recreational" drugs for at least a week (preferably 2 weeks) before your surgery.  Please be advised that the combination of cocaine and anesthesia may have negative outcomes, up to and including death. If you test positive for cocaine, your surgery will be cancelled.  On the morning of surgery brush your teeth with toothpaste and water, you may rinse your mouth with mouthwash if you wish. Do not swallow any toothpaste or mouthwash.  Use CHG Soap or wipes as directed on instruction sheet.  Do not wear lotions, powders, or perfumes.   Do not shave body hair from the neck down 48 hours before surgery.  Wear comfortable clothing (specific to your surgery type) to the hospital.  Do not wear jewelry, make-up, hairpins, clips or nail polish.  For welded (permanent) jewelry: bracelets, anklets, waist bands, etc.  Please have this removed prior to surgery.  If  it is not removed, there is a chance that hospital personnel will need to cut it off on the day of surgery. Contact lenses, hearing aids and dentures may not be worn into surgery.  Do not bring valuables to the hospital. [facility] is not responsible for any missing/lost belongings or valuables.   Notify your doctor if there is any change in your medical condition (cold, fever, infection).  If you are being discharged the day of surgery, you will not be allowed to drive home. You will need a responsible individual to drive you home and stay with you for 24 hours after surgery.   If you are taking public transportation, you will need to have a responsible individual with you.  After surgery, you can help prevent lung complications by doing breathing exercises.  Take deep breaths and cough every 1-2 hours. Your doctor may order a device called an Incentive Spirometer to help you take deep breaths. When coughing or sneezing, hold a pillow firmly against your incision with both hands. This is called "splinting." Doing this helps protect your incision. It also decreases belly discomfort.  Inpatient Visitation:    Call the [facility] for visitor policy  Please call the Pre-admissions Testing Dept. at [phone] if you have any questions about these instructions.     Preparing for Surgery with CHLORHEXIDINE GLUCONATE (CHG) Soap  Chlorhexidine Gluconate (CHG) Soap  o An antiseptic cleaner that kills germs and bonds with the skin to continue killing germs even after  washing  o Used for showering the night before surgery and morning of surgery  Before surgery, you can play an important role by reducing the number of germs on your skin.  CHG (Chlorhexidine gluconate) soap is an antiseptic cleanser which kills germs and bonds with the skin to continue killing germs even after washing.  Please do not use if you have an allergy to CHG or antibacterial soaps. If your skin becomes  reddened/irritated stop using the CHG.  1. Shower the NIGHT BEFORE SURGERY and the MORNING OF SURGERY with CHG soap.  2. If you choose to wash your hair, wash your hair first as usual with your normal shampoo.  3. After shampooing, rinse your hair and body thoroughly to remove the shampoo.  4. Use CHG as you would any other liquid soap. You can apply CHG directly to the skin and wash gently with a scrungie or a clean washcloth. Avoid using of breasts if planning to breastfeed. Use your regular soap .  5. Apply the CHG soap to your body only from the neck down. Do not use on open wounds or open sores. Avoid contact with your eyes, ears, mouth, and genitals (private parts). Wash face and genitals (private parts) with your normal soap.  6. Wash thoroughly, paying special attention to the area where your surgery will be performed.  7. Thoroughly rinse your body with warm water.  8. Do not shower/wash with your normal soap after using and rinsing off the CHG soap.  9. Pat yourself dry with a clean towel.  10. Wear clean pajamas to bed the night before surgery.  12. Place clean sheets on your bed the night of your first shower and do not sleep with pets.  13. Shower again with the CHG soap on the day of surgery prior to arriving at the hospital.  14. Do not apply any deodorants/lotions/powders.  15. Please wear clean clothes to the hospital.

## 2023-07-17 ENCOUNTER — Encounter
Admission: RE | Admit: 2023-07-17 | Discharge: 2023-07-17 | Disposition: A | Discharge status: Home / Self Care | Source: Ambulatory Visit | Attending: Obstetrics and Gynecology | Admitting: Obstetrics and Gynecology

## 2023-07-17 ENCOUNTER — Encounter: Payer: Self-pay | Admitting: Urgent Care

## 2023-07-17 DIAGNOSIS — Z3A35 35 weeks gestation of pregnancy: Secondary | ICD-10-CM | POA: Diagnosis not present

## 2023-07-17 DIAGNOSIS — Z3403 Encounter for supervision of normal first pregnancy, third trimester: Secondary | ICD-10-CM

## 2023-07-17 DIAGNOSIS — O34219 Maternal care for unspecified type scar from previous cesarean delivery: Secondary | ICD-10-CM | POA: Insufficient documentation

## 2023-07-17 DIAGNOSIS — D696 Thrombocytopenia, unspecified: Secondary | ICD-10-CM | POA: Diagnosis not present

## 2023-07-17 DIAGNOSIS — Z2821 Immunization not carried out because of patient refusal: Secondary | ICD-10-CM | POA: Diagnosis not present

## 2023-07-17 DIAGNOSIS — Z412 Encounter for routine and ritual male circumcision: Secondary | ICD-10-CM | POA: Diagnosis not present

## 2023-07-17 DIAGNOSIS — Z8249 Family history of ischemic heart disease and other diseases of the circulatory system: Secondary | ICD-10-CM | POA: Diagnosis not present

## 2023-07-17 DIAGNOSIS — Z3A39 39 weeks gestation of pregnancy: Secondary | ICD-10-CM | POA: Diagnosis not present

## 2023-07-17 DIAGNOSIS — Z8616 Personal history of COVID-19: Secondary | ICD-10-CM | POA: Diagnosis not present

## 2023-07-17 DIAGNOSIS — Z2882 Immunization not carried out because of caregiver refusal: Secondary | ICD-10-CM | POA: Diagnosis not present

## 2023-07-17 DIAGNOSIS — Z833 Family history of diabetes mellitus: Secondary | ICD-10-CM | POA: Diagnosis not present

## 2023-07-17 DIAGNOSIS — Z302 Encounter for sterilization: Secondary | ICD-10-CM | POA: Diagnosis not present

## 2023-07-17 DIAGNOSIS — O34211 Maternal care for low transverse scar from previous cesarean delivery: Secondary | ICD-10-CM | POA: Diagnosis not present

## 2023-07-17 DIAGNOSIS — O0993 Supervision of high risk pregnancy, unspecified, third trimester: Secondary | ICD-10-CM | POA: Diagnosis not present

## 2023-07-17 DIAGNOSIS — O9912 Other diseases of the blood and blood-forming organs and certain disorders involving the immune mechanism complicating childbirth: Secondary | ICD-10-CM | POA: Diagnosis not present

## 2023-07-17 LAB — CBC
HCT: 38.7 % (ref 36.0–46.0)
Hemoglobin: 13.1 g/dL (ref 12.0–15.0)
MCH: 28 pg (ref 26.0–34.0)
MCHC: 33.9 g/dL (ref 30.0–36.0)
MCV: 82.7 fL (ref 80.0–100.0)
Platelets: 148 10*3/uL — ABNORMAL LOW (ref 150–400)
RBC: 4.68 MIL/uL (ref 3.87–5.11)
RDW: 13.5 % (ref 11.5–15.5)
WBC: 10.1 10*3/uL (ref 4.0–10.5)
nRBC: 0 % (ref 0.0–0.2)

## 2023-07-17 LAB — RAPID HIV SCREEN (HIV 1/2 AB+AG)
HIV 1/2 Antibodies: NONREACTIVE
HIV-1 P24 Antigen - HIV24: NONREACTIVE

## 2023-07-17 LAB — TYPE AND SCREEN
ABO/RH(D): A POS
Antibody Screen: NEGATIVE
Extend sample reason: UNDETERMINED

## 2023-07-18 ENCOUNTER — Inpatient Hospital Stay: Payer: Self-pay

## 2023-07-18 ENCOUNTER — Encounter
Admission: RE | Disposition: A | Discharge status: Home / Self Care | Payer: Self-pay | Source: Home / Self Care | Attending: Obstetrics and Gynecology

## 2023-07-18 ENCOUNTER — Other Ambulatory Visit: Payer: Self-pay

## 2023-07-18 ENCOUNTER — Inpatient Hospital Stay
Admission: RE | Admit: 2023-07-18 | Discharge: 2023-07-19 | DRG: 784 | Disposition: A | Discharge status: Home / Self Care | Payer: BC Managed Care – PPO | Attending: Obstetrics and Gynecology | Admitting: Obstetrics and Gynecology

## 2023-07-18 ENCOUNTER — Encounter: Payer: Self-pay | Admitting: Obstetrics and Gynecology

## 2023-07-18 DIAGNOSIS — O34211 Maternal care for low transverse scar from previous cesarean delivery: Principal | ICD-10-CM | POA: Diagnosis present

## 2023-07-18 DIAGNOSIS — Z98891 History of uterine scar from previous surgery: Secondary | ICD-10-CM

## 2023-07-18 DIAGNOSIS — Z8249 Family history of ischemic heart disease and other diseases of the circulatory system: Secondary | ICD-10-CM

## 2023-07-18 DIAGNOSIS — D696 Thrombocytopenia, unspecified: Secondary | ICD-10-CM | POA: Diagnosis present

## 2023-07-18 DIAGNOSIS — Z302 Encounter for sterilization: Secondary | ICD-10-CM

## 2023-07-18 DIAGNOSIS — Z833 Family history of diabetes mellitus: Secondary | ICD-10-CM | POA: Diagnosis not present

## 2023-07-18 DIAGNOSIS — Z3A35 35 weeks gestation of pregnancy: Secondary | ICD-10-CM

## 2023-07-18 DIAGNOSIS — Z8616 Personal history of COVID-19: Secondary | ICD-10-CM

## 2023-07-18 DIAGNOSIS — O0990 Supervision of high risk pregnancy, unspecified, unspecified trimester: Secondary | ICD-10-CM

## 2023-07-18 DIAGNOSIS — O34219 Maternal care for unspecified type scar from previous cesarean delivery: Principal | ICD-10-CM

## 2023-07-18 DIAGNOSIS — Z3A39 39 weeks gestation of pregnancy: Secondary | ICD-10-CM

## 2023-07-18 DIAGNOSIS — O9912 Other diseases of the blood and blood-forming organs and certain disorders involving the immune mechanism complicating childbirth: Secondary | ICD-10-CM | POA: Diagnosis present

## 2023-07-18 DIAGNOSIS — Z3403 Encounter for supervision of normal first pregnancy, third trimester: Secondary | ICD-10-CM

## 2023-07-18 LAB — RPR: RPR Ser Ql: NONREACTIVE

## 2023-07-18 SURGERY — Surgical Case
Anesthesia: Spinal

## 2023-07-18 MED ORDER — BUPIVACAINE HCL (PF) 0.5 % IJ SOLN
5.0000 mL | Freq: Once | INTRAMUSCULAR | Status: DC
  Filled 2023-07-18: qty 10

## 2023-07-18 MED ORDER — IBUPROFEN 600 MG PO TABS: 600.0000 mg | ORAL_TABLET | Freq: Four times a day (QID) | ORAL | Status: DC

## 2023-07-18 MED ORDER — KETOROLAC TROMETHAMINE 30 MG/ML IJ SOLN
INTRAMUSCULAR | Status: AC
Start: 2023-07-18 — End: ?
  Filled 2023-07-18: qty 1

## 2023-07-18 MED ORDER — SOD CITRATE-CITRIC ACID 500-334 MG/5ML PO SOLN
30.0000 mL | ORAL | Status: AC
  Administered 2023-07-18: 30 mL via ORAL

## 2023-07-18 MED ORDER — ONDANSETRON HCL 4 MG/2ML IJ SOLN
INTRAMUSCULAR | Status: DC | PRN
  Administered 2023-07-18: 4 mg via INTRAVENOUS

## 2023-07-18 MED ORDER — LACTATED RINGERS IV BOLUS
500.0000 mL | Freq: Once | INTRAVENOUS | Status: AC
  Administered 2023-07-18: 500 mL via INTRAVENOUS

## 2023-07-18 MED ORDER — PHENYLEPHRINE HCL-NACL 20-0.9 MG/250ML-% IV SOLN
INTRAVENOUS | Status: AC
  Filled 2023-07-18: qty 250

## 2023-07-18 MED ORDER — SENNOSIDES-DOCUSATE SODIUM 8.6-50 MG PO TABS
2.0000 | ORAL_TABLET | ORAL | Status: DC
  Administered 2023-07-18: 2 via ORAL
  Filled 2023-07-18: qty 2

## 2023-07-18 MED ORDER — OXYCODONE HCL 5 MG PO TABS: 5.0000 mg | ORAL_TABLET | Freq: Once | ORAL | Status: DC | PRN

## 2023-07-18 MED ORDER — OXYCODONE-ACETAMINOPHEN 5-325 MG PO TABS: 1.0000 | ORAL_TABLET | ORAL | Status: DC | PRN

## 2023-07-18 MED ORDER — MENTHOL 3 MG MT LOZG: 1.0000 | LOZENGE | OROMUCOSAL | Status: DC | PRN

## 2023-07-18 MED ORDER — MORPHINE SULFATE (PF) 0.5 MG/ML IJ SOLN
INTRAMUSCULAR | Status: DC | PRN
  Administered 2023-07-18: .2 mg via EPIDURAL

## 2023-07-18 MED ORDER — DEXAMETHASONE SODIUM PHOSPHATE 10 MG/ML IJ SOLN
INTRAMUSCULAR | Status: AC
  Filled 2023-07-18: qty 1

## 2023-07-18 MED ORDER — FENTANYL CITRATE (PF) 100 MCG/2ML IJ SOLN: 25.0000 ug | INTRAMUSCULAR | Status: DC | PRN

## 2023-07-18 MED ORDER — PHENYLEPHRINE HCL-NACL 20-0.9 MG/250ML-% IV SOLN: INTRAVENOUS | Status: DC | PRN

## 2023-07-18 MED ORDER — OXYTOCIN-SODIUM CHLORIDE 30-0.9 UT/500ML-% IV SOLN
2.5000 [IU]/h | INTRAVENOUS | Status: DC
Start: 2023-07-18 — End: 2023-07-19

## 2023-07-18 MED ORDER — ACETAMINOPHEN 325 MG PO TABS
650.0000 mg | ORAL_TABLET | Freq: Four times a day (QID) | ORAL | Status: AC
  Administered 2023-07-18 – 2023-07-19 (×4): 650 mg via ORAL
  Filled 2023-07-18 (×4): qty 2

## 2023-07-18 MED ORDER — OXYTOCIN-SODIUM CHLORIDE 30-0.9 UT/500ML-% IV SOLN
INTRAVENOUS | Status: DC | PRN
  Administered 2023-07-18: 20 m[IU]/min via INTRAVENOUS

## 2023-07-18 MED ORDER — OXYTOCIN-SODIUM CHLORIDE 30-0.9 UT/500ML-% IV SOLN
INTRAVENOUS | Status: AC
Start: 2023-07-18 — End: 2023-07-19
  Filled 2023-07-18: qty 500

## 2023-07-18 MED ORDER — PHENYLEPHRINE 80 MCG/ML (10ML) SYRINGE FOR IV PUSH (FOR BLOOD PRESSURE SUPPORT)
PREFILLED_SYRINGE | INTRAVENOUS | Status: DC | PRN
  Administered 2023-07-18: 80 ug via INTRAVENOUS

## 2023-07-18 MED ORDER — MORPHINE SULFATE (PF) 0.5 MG/ML IJ SOLN
INTRAMUSCULAR | Status: AC
  Filled 2023-07-18: qty 10

## 2023-07-18 MED ORDER — SODIUM CHLORIDE 0.9 % IV SOLN: 12.5000 mg | Freq: Four times a day (QID) | INTRAVENOUS | Status: DC | PRN

## 2023-07-18 MED ORDER — DEXAMETHASONE SODIUM PHOSPHATE 10 MG/ML IJ SOLN
INTRAMUSCULAR | Status: DC | PRN
  Administered 2023-07-18: 10 mg via INTRAVENOUS

## 2023-07-18 MED ORDER — KETOROLAC TROMETHAMINE 30 MG/ML IJ SOLN
30.0000 mg | Freq: Four times a day (QID) | INTRAMUSCULAR | Status: AC
  Administered 2023-07-18 – 2023-07-19 (×4): 30 mg via INTRAVENOUS
  Filled 2023-07-18 (×3): qty 1

## 2023-07-18 MED ORDER — LACTATED RINGERS IV BOLUS: 500.0000 mL | Freq: Once | INTRAVENOUS | Status: DC

## 2023-07-18 MED ORDER — BUPIVACAINE HCL (PF) 0.5 % IJ SOLN
INTRAMUSCULAR | Status: DC | PRN
  Administered 2023-07-18: 10 mL

## 2023-07-18 MED ORDER — SOD CITRATE-CITRIC ACID 500-334 MG/5ML PO SOLN
ORAL | Status: AC
  Filled 2023-07-18: qty 15

## 2023-07-18 MED ORDER — BUPIVACAINE HCL (PF) 0.5 % IJ SOLN: 5.0000 mL | Freq: Once | INTRAMUSCULAR | Status: DC

## 2023-07-18 MED ORDER — OXYCODONE HCL 5 MG PO TABS: 5.0000 mg | ORAL_TABLET | ORAL | Status: AC | PRN

## 2023-07-18 MED ORDER — OXYTOCIN-SODIUM CHLORIDE 30-0.9 UT/500ML-% IV SOLN
INTRAVENOUS | Status: AC
  Filled 2023-07-18: qty 500

## 2023-07-18 MED ORDER — LACTATED RINGERS IV SOLN: INTRAVENOUS | Status: DC

## 2023-07-18 MED ORDER — KETOROLAC TROMETHAMINE 30 MG/ML IJ SOLN
30.0000 mg | Freq: Four times a day (QID) | INTRAMUSCULAR | Status: AC
  Filled 2023-07-18: qty 1

## 2023-07-18 MED ORDER — OXYCODONE HCL 5 MG/5ML PO SOLN: 5.0000 mg | Freq: Once | ORAL | Status: DC | PRN

## 2023-07-18 MED ORDER — FENTANYL CITRATE (PF) 100 MCG/2ML IJ SOLN
INTRAMUSCULAR | Status: AC
  Filled 2023-07-18: qty 2

## 2023-07-18 MED ORDER — ONDANSETRON HCL 4 MG/2ML IJ SOLN
INTRAMUSCULAR | Status: AC
  Filled 2023-07-18: qty 2

## 2023-07-18 MED ORDER — DIPHENHYDRAMINE HCL 25 MG PO CAPS: 25.0000 mg | ORAL_CAPSULE | Freq: Four times a day (QID) | ORAL | Status: DC | PRN

## 2023-07-18 MED ORDER — OXYCODONE-ACETAMINOPHEN 5-325 MG PO TABS: 2.0000 | ORAL_TABLET | ORAL | Status: DC | PRN

## 2023-07-18 MED ORDER — NALOXONE HCL 0.4 MG/ML IJ SOLN: 0.4000 mg | INTRAMUSCULAR | Status: DC | PRN

## 2023-07-18 MED ORDER — NALOXONE HCL 4 MG/10ML IJ SOLN: 1.0000 ug/kg/h | INTRAVENOUS | Status: DC | PRN

## 2023-07-18 MED ORDER — DIPHENHYDRAMINE HCL 50 MG/ML IJ SOLN: 12.5000 mg | INTRAMUSCULAR | Status: DC | PRN

## 2023-07-18 MED ORDER — CEFAZOLIN SODIUM-DEXTROSE 2-4 GM/100ML-% IV SOLN
2.0000 g | INTRAVENOUS | Status: AC
  Administered 2023-07-18: 2 g via INTRAVENOUS
  Filled 2023-07-18: qty 100

## 2023-07-18 MED ORDER — MEPERIDINE HCL 25 MG/ML IJ SOLN: 6.2500 mg | INTRAMUSCULAR | Status: DC | PRN

## 2023-07-18 MED ORDER — BUPIVACAINE IN DEXTROSE 0.75-8.25 % IT SOLN
INTRATHECAL | Status: DC | PRN
  Administered 2023-07-18: 1.5 mL via INTRATHECAL

## 2023-07-18 MED ORDER — DIPHENHYDRAMINE HCL 25 MG PO CAPS: 25.0000 mg | ORAL_CAPSULE | ORAL | Status: DC | PRN

## 2023-07-18 MED ORDER — DIBUCAINE (PERIANAL) 1 % EX OINT: 1.0000 | TOPICAL_OINTMENT | CUTANEOUS | Status: DC | PRN

## 2023-07-18 MED ORDER — BUPIVACAINE 0.25 % ON-Q PUMP DUAL CATH 400 ML
400.0000 mL | INJECTION | Status: DC
  Filled 2023-07-18: qty 400

## 2023-07-18 MED ORDER — WITCH HAZEL-GLYCERIN EX PADS: 1.0000 | MEDICATED_PAD | CUTANEOUS | Status: DC | PRN

## 2023-07-18 MED ORDER — KETOROLAC TROMETHAMINE 30 MG/ML IJ SOLN
30.0000 mg | Freq: Four times a day (QID) | INTRAMUSCULAR | Status: DC
Start: 2023-07-18 — End: 2023-07-18
  Administered 2023-07-18: 30 mg via INTRAVENOUS

## 2023-07-18 MED ORDER — FERROUS SULFATE 325 (65 FE) MG PO TABS
325.0000 mg | ORAL_TABLET | Freq: Two times a day (BID) | ORAL | Status: DC
  Administered 2023-07-18 – 2023-07-19 (×2): 325 mg via ORAL
  Filled 2023-07-18 (×2): qty 1

## 2023-07-18 MED ORDER — SIMETHICONE 80 MG PO CHEW
80.0000 mg | CHEWABLE_TABLET | Freq: Three times a day (TID) | ORAL | Status: DC
  Administered 2023-07-18 – 2023-07-19 (×3): 80 mg via ORAL
  Filled 2023-07-18 (×3): qty 1

## 2023-07-18 MED ORDER — PHENYLEPHRINE 80 MCG/ML (10ML) SYRINGE FOR IV PUSH (FOR BLOOD PRESSURE SUPPORT)
PREFILLED_SYRINGE | INTRAVENOUS | Status: AC
  Filled 2023-07-18: qty 10

## 2023-07-18 MED ORDER — FENTANYL CITRATE (PF) 100 MCG/2ML IJ SOLN
INTRAMUSCULAR | Status: DC | PRN
  Administered 2023-07-18: 15 ug via INTRATHECAL

## 2023-07-18 MED ORDER — COCONUT OIL OIL: 1.0000 | TOPICAL_OIL | Status: DC | PRN

## 2023-07-18 MED ORDER — SODIUM CHLORIDE 0.9% FLUSH: 3.0000 mL | INTRAVENOUS | Status: DC | PRN

## 2023-07-18 MED ORDER — ONDANSETRON HCL 4 MG/2ML IJ SOLN
4.0000 mg | Freq: Three times a day (TID) | INTRAMUSCULAR | Status: DC | PRN
  Administered 2023-07-18: 4 mg via INTRAVENOUS
  Filled 2023-07-18: qty 2

## 2023-07-18 MED ORDER — PHENYLEPHRINE HCL-NACL 20-0.9 MG/250ML-% IV SOLN
INTRAVENOUS | Status: DC | PRN
  Administered 2023-07-18: 50 ug/min via INTRAVENOUS
  Administered 2023-07-18: 40 ug/min via INTRAVENOUS

## 2023-07-18 MED ORDER — LACTATED RINGERS IV BOLUS
1000.0000 mL | Freq: Once | INTRAVENOUS | Status: AC
  Administered 2023-07-18: 1000 mL via INTRAVENOUS

## 2023-07-18 MED ORDER — PRENATAL MULTIVITAMIN CH
1.0000 | ORAL_TABLET | Freq: Every day | ORAL | Status: DC
  Administered 2023-07-19: 1 via ORAL
  Filled 2023-07-18: qty 1

## 2023-07-18 SURGICAL SUPPLY — 32 items
BENZOIN TINCTURE PRP APPL 2/3 (GAUZE/BANDAGES/DRESSINGS) ×1 IMPLANT
CATH KIT ON-Q SILVERSOAK 5 (CATHETERS) ×2 IMPLANT
CATH KIT ON-Q SILVERSOAK 5IN (CATHETERS) ×2 IMPLANT
DERMABOND ADVANCED .7 DNX12 (GAUZE/BANDAGES/DRESSINGS) ×1 IMPLANT
DERMABOND ADVANCED .7 DNX6 (GAUZE/BANDAGES/DRESSINGS) IMPLANT
DRSG OPSITE POSTOP 4X10 (GAUZE/BANDAGES/DRESSINGS) ×1 IMPLANT
DRSG OPSITE POSTOP 4X12 (GAUZE/BANDAGES/DRESSINGS) IMPLANT
DRSG TELFA 3X8 NADH STRL (GAUZE/BANDAGES/DRESSINGS) ×1 IMPLANT
ELECT CAUTERY BLADE 6.4 (BLADE) ×1 IMPLANT
ELECT REM PT RETURN 9FT ADLT (ELECTROSURGICAL) ×1 IMPLANT
ELECTRODE REM PT RTRN 9FT ADLT (ELECTROSURGICAL) ×1 IMPLANT
GAUZE SPONGE 4X4 12PLY STRL (GAUZE/BANDAGES/DRESSINGS) ×1 IMPLANT
GLOVE BIO SURGEON STRL SZ7 (GLOVE) ×1 IMPLANT
GLOVE INDICATOR 7.5 STRL GRN (GLOVE) ×1 IMPLANT
GOWN STRL REUS W/ TWL LRG LVL3 (GOWN DISPOSABLE) ×3 IMPLANT
LIGASURE IMPACT 36 18CM CVD LR (INSTRUMENTS) IMPLANT
MANIFOLD NEPTUNE II (INSTRUMENTS) ×1 IMPLANT
MAT PREVALON FULL STRYKER (MISCELLANEOUS) ×1 IMPLANT
NS IRRIG 1000ML POUR BTL (IV SOLUTION) ×1 IMPLANT
PACK C SECTION AR (MISCELLANEOUS) ×1 IMPLANT
PAD OB MATERNITY 11 LF (PERSONAL CARE ITEMS) ×2 IMPLANT
PAD PREP OB/GYN DISP 24X41 (PERSONAL CARE ITEMS) ×1 IMPLANT
SCRUB CHG 4% DYNA-HEX 4OZ (MISCELLANEOUS) ×1 IMPLANT
STAPLER INSORB 30 2030 C-SECTI (MISCELLANEOUS) IMPLANT
STRIP CLOSURE SKIN 1/2X4 (GAUZE/BANDAGES/DRESSINGS) ×1 IMPLANT
SUT MNCRL 4-0 27 PS-2 XMFL (SUTURE) ×1 IMPLANT
SUT PDS AB 1 TP1 96 (SUTURE) ×1 IMPLANT
SUT VIC AB 0 CTX36XBRD ANBCTRL (SUTURE) ×2 IMPLANT
SUT VICRYL 3-0 CR8 SH (SUTURE) IMPLANT
SUTURE MNCRL 4-0 27XMF (SUTURE) ×1 IMPLANT
TRAP FLUID SMOKE EVACUATOR (MISCELLANEOUS) ×1 IMPLANT
WATER STERILE IRR 500ML POUR (IV SOLUTION) ×1 IMPLANT

## 2023-07-18 NOTE — Anesthesia Procedure Notes (Signed)
 Spinal  Patient location during procedure: OR Start time: 07/18/2023 10:30 AM End time: 07/18/2023 10:38 AM Reason for block: surgical anesthesia Staffing Performed by: Otho Perl, CRNA Authorized by: Rosaria Ferries, MD   Preanesthetic Checklist Completed: patient identified, IV checked, site marked, risks and benefits discussed, surgical consent, monitors and equipment checked, pre-op evaluation and timeout performed Spinal Block Patient position: sitting Prep: Betadine Patient monitoring: heart rate, continuous pulse ox, blood pressure and cardiac monitor Approach: midline Location: L4-5 Injection technique: single-shot Needle Needle type: Pencan  Needle gauge: 24 G Needle length: 9 cm Assessment Sensory level: T6 Events: CSF return Additional Notes Negative paresthesia. Negative blood return. Positive free-flowing CSF. Expiration date of kit checked and confirmed. Patient tolerated procedure well, without complications.

## 2023-07-18 NOTE — Anesthesia Preprocedure Evaluation (Signed)
 Anesthesia Evaluation  Patient identified by MRN, date of birth, ID band Patient awake    Reviewed: Allergy & Precautions, NPO status , Patient's Chart, lab work & pertinent test results  History of Anesthesia Complications (+) PONV and history of anesthetic complications  Airway Mallampati: II  TM Distance: >3 FB Neck ROM: full    Dental  (+) Chipped   Pulmonary neg pulmonary ROS, neg shortness of breath   Pulmonary exam normal        Cardiovascular Exercise Tolerance: Good (-) hypertensionnegative cardio ROS Normal cardiovascular exam     Neuro/Psych    GI/Hepatic negative GI ROS,neg GERD  ,,  Endo/Other    Renal/GU   negative genitourinary   Musculoskeletal   Abdominal   Peds  Hematology negative hematology ROS (+)   Anesthesia Other Findings Past Medical History: No date: Anemia 03/24/2015: Dysplastic nevus     Comment:  left side above waistline, mild atypia, lateral margin               involved. No date: PONV (postoperative nausea and vomiting)  Past Surgical History: No date: BREAST BIOPSY; Left 10/18/2021: CESAREAN SECTION     Comment:  Procedure: CESAREAN SECTION;  Surgeon: Conard Novak, MD;  Location: [facility] ORS;  Service: Obstetrics;; No date: WISDOM TOOTH EXTRACTION  BMI    Body Mass Index: 30.18 kg/m      Reproductive/Obstetrics (+) Pregnancy                             Anesthesia Physical Anesthesia Plan  ASA: 2  Anesthesia Plan: Spinal   Post-op Pain Management:    Induction:   PONV Risk Score and Plan:   Airway Management Planned: Natural Airway and Nasal Cannula  Additional Equipment:   Intra-op Plan:   Post-operative Plan:   Informed Consent: I have reviewed the patients History and Physical, chart, labs and discussed the procedure including the risks, benefits and alternatives for the proposed anesthesia with the patient  or authorized representative who has indicated his/her understanding and acceptance.     Dental Advisory Given  Plan Discussed with: Anesthesiologist, CRNA and Surgeon  Anesthesia Plan Comments: (Patient reports no bleeding problems and no anticoagulant use.  Plan for spinal with backup GA  Patient consented for risks of anesthesia including but not limited to:  - adverse reactions to medications - damage to eyes, teeth, lips or other oral mucosa - nerve damage due to positioning  - risk of bleeding, infection and or nerve damage from spinal that could lead to paralysis - risk of headache or failed spinal - damage to teeth, lips or other oral mucosa - sore throat or hoarseness - damage to heart, brain, nerves, lungs, other parts of body or loss of life  Patient voiced understanding and assent.)       Anesthesia Quick Evaluation

## 2023-07-18 NOTE — Lactation Note (Addendum)
 This note was copied from a baby's chart. Lactation Consultation Note  Patient Name: Kim Shannon [id number] Date: 07/18/2023 Age:30 hours Reason for consult: Initial assessment;Mother's request;Term;RN request   Maternal Data This is mom's 2nd baby, repeat C/S. Mom with history of anemia and breast biopsy.   On initial visit mom reports baby has latched a 2nd time since birth for 16 minutes after being placed skin to skin and has just completed a feeding.. Once the baby showed feeding cues mom offered baby to breast. Baby has had a stool diaper per mom. Mom is an experienced breastfeeding mother. Has patient been taught Hand Expression?: Yes Does the patient have breastfeeding experience prior to this delivery?: Yes How long did the patient breastfeed?: 13 months  Feeding Mother's Current Feeding Choice: Breast Milk   Interventions Interventions: Breast feeding basics reviewed;Education Reviewed what to expect in the first days when breastfeeding: how to know the baby is getting enough, 8-12 feeds in 24 hours, cluster feeding, how to wake a sleepy baby, and feeding cues. LC number on white board if mom would like LC assistance.  Discharge Pump: Personal  Consult Status Consult Status: Follow-up Date: 07/19/23 Follow-up type: In-patient  Update provided to care nurse.  Fuller Song 07/18/2023, 4:33 PM

## 2023-07-18 NOTE — Op Note (Signed)
 Cesarean Section Operative Note    Patient Name: Kim Shannon  Date of Birth: Apr 29, 1995  MRN: [id number]  Date of Surgery: 07/18/2023   Pre-operative Diagnosis:  1) History of prior cesarean delivery, desires repeat 2) Desires permanent sterilization 3) intrauterine pregnancy at [redacted]w[redacted]d   Post-operative Diagnosis:  1) History of prior cesarean delivery, desires repeat 2) Desires permanent sterilization 3) intrauterine pregnancy at [redacted]w[redacted]d    Procedure:  1) Repeat Low Transverse Cesarean Delivery via Pfannenstiel incision with double layer uterine closure 2) Bilateral salpingectomy  Surgeon: Surgeons and Role:    Conard Novak, MD - Primary   Assistants: Margaretmary Eddy, CNM; No other capable assistant available, in surgery requiring high level assistant.  Anesthesia: spinal   Findings:  1) normal appearing gravid uterus, fallopian tubes, and ovaries 2) Viable female infant with APGARs 9 and 9, weight 3,580 grams   Quantified Blood Loss: 550 mL  Total IV Fluids: 1,300 ml   Urine Output:  150 mL clear urine at end of procedure  Specimens: right and left fallopian tubes  Complications: no complications  Disposition: PACU - hemodynamically stable.   Maternal Condition: stable   Baby condition / location:  Couplet care / Skin to Skin  Procedure Details:  The patient was seen in the Holding Room. The risks, benefits, complications, treatment options, and expected outcomes were discussed with the patient. The patient concurred with the proposed plan, giving informed consent. identified as Kim Shannon and the procedure verified as C-Section Delivery. A Time Out was held and the above information confirmed.   After induction of anesthesia, the patient was draped and prepped in the usual sterile manner. A Pfannenstiel incision was made and carried down through the subcutaneous tissue to the fascia. Fascial incision was made and extended transversely. The fascia was  separated from the underlying rectus tissue superiorly and inferiorly. The peritoneum was identified and entered. Peritoneal incision was extended longitudinally. The bladder flap was bluntly and sharply freed from the lower uterine segment. A low transverse uterine incision was made and the hysterotomy was extended with cranial-caudal tension. Delivered from cephalic presentation was a 3,580 gram Living newborn infant(s) or Female with Apgar scores of 9 at one minute and 9 at five minutes. Cord ph was not sent the umbilical cord was clamped and cut cord blood was not obtained for evaluation. The placenta was removed Intact and appeared normal. The uterine outline, tubes and ovaries appeared normal. The uterine incision was closed with running locked sutures of 0 Vicryl.  A second layer of the same suture was thrown in an imbricating fashion.  Hemostasis was assured.    The tubal ligation portion of the procedure was performed at this point.  The left fallopian tube was identified and followed out to the fimbriated end.  Two Babcock clamps were used to grasp the tube and elevate the tube away from the underlying engorged vasculature.  The LigaSure Impact device was utilized to cuaterize and ligate the tube at the cornual region and along the mesosalpinx. The entire fallopian tube was removed.  The same procedure was performed on the right fallopian tube with hemostasis noted.   The uterus was returned to the abdomen and the paracolic gutters were cleared of all clots and debris.  The rectus muscles were inspected and found to be hemostatic.  The On-Q catheter pumps were inserted in accordance with the manufacturer's recommendations.  The catheters were inserted approximately 4cm cephelad to the incision line, approximately 1cm  apart, straddling the midline.  They were inserted to a depth of the 4th mark. They were positioned superficial to the rectus abdominus muscles and deep to the rectus fascia.    The  fascia was then reapproximated with running sutures of 1-0 PDS, looped. The subcuticular closure was performed using 4-0 monocryl. The skin closure was reinforced using surgical skin glue.  The On-Q catheters were bolused with 5 mL of 0.5% marcaine plain for a total of 10 mL.  The catheters were affixed to the skin with surgical skin glue, steri-strips, and tegaderm.    The surgical assistant performed tissue retraction, assistance with suturing, and fundal pressure.  Instrument, sponge, and needle counts were correct prior the abdominal closure and were correct at the conclusion of the case.  The patient received Ancef 2 gram IV prior to skin incision (within 30 minutes). For VTE prophylaxis she was wearing SCDs throughout the case.  The assistant surgeon was a CNM due to lack of availability of another Sales promotion account executive.    Signed: Conard Novak, MD 07/18/2023 12:02 PM

## 2023-07-18 NOTE — Discharge Summary (Signed)
 Postpartum Discharge Summary  Patient Name: Kim Shannon DOB: 10-07-1995 MRN: [id number]  Date of admission: 07/18/2023 Delivery date:07/18/2023 Delivering provider: Thomasene Mohair D Date of discharge: 07/18/2023  Primary OB: [facility] LMP:No LMP recorded. EDC Estimated Date of Delivery: 07/25/23 Gestational Age at Delivery: [redacted]w[redacted]d   Admitting diagnosis: History of cesarean delivery [Z98.891] Intrauterine pregnancy: [redacted]w[redacted]d     Secondary diagnosis:   Principal Problem:   History of cesarean delivery Active Problems:   [redacted] weeks gestation of pregnancy   Admission for sterilization   Supervision of high-risk pregnancy   Discharge Diagnosis: Term Pregnancy Delivered and Sterilization       Hospital course: Sceduled C/S   28 y.o. yo [id number] at [redacted]w[redacted]d was admitted to the hospital 07/18/2023 for scheduled cesarean section with the following indication:Elective Repeat.Delivery details are as follows:  Membrane Rupture Time/Date: 11:03 AM,07/18/2023  Delivery Method:C-Section, Low Transverse Operative Delivery:N/A Details of operation can be found in separate operative note.  Patient had a postpartum course complicated by***.  She is ambulating, tolerating a regular diet, passing flatus, and urinating well. Patient is discharged home in stable condition on  07/18/23        Newborn Data: Birth date:07/18/2023 Birth time:11:04 AM Gender:Female Living status:Living Apgars:9 ,9  Weight:3580 g                                              Post partum procedures:{Postpartum procedures:23558} Complications: {OB Labor/Delivery Complications:20784} Delivery Type: repeat cesarean section, low transverse incision Anesthesia: spinal anesthesia Placenta: spontaneous To Pathology: No   Prenatal Labs:  Blood type/Rh A +  Antibody screen negative  Rubella Immune  Varicella Immune    RPR NR  HBsAg negative  HIV negative  Hep C negative  GC negative  Chlamydia negative  Genetic  screening NIPS diploid  1 hour GTT 58  3 hour GTT N/a  GBS unknown   Magnesium Sulfate received: No BMZ received: No Rhophylac:was not indicated MMR: was not indicated Varivax vaccine given: was not indicated - Tdap vaccine: Given prenatally (given 05/03/2023) - Flu vaccine: Declined -RSV vaccine:out of season  Transfusion:{Transfusion received:30440034}  Physical exam  Vitals:   07/18/23 0823  BP: 116/81  Pulse: 74  Resp: 16  Temp: (!) 96.5 F (35.8 C)  TempSrc: Oral  Weight: 84.8 kg  Height: 5\' 6"  (1.676 m)   General: {Exam; general:21111117} Lochia: {Desc; appropriate/inappropriate:30686::"appropriate"} Uterine Fundus: {Desc; firm/soft:30687} Perineum:***minimal edema/{OB Perineal assessment:24215} Incision: {Exam; incision:21111123}, covered with occlusive OP site dressing *** DVT Evaluation: {Exam; JYN:8295621}  Labs: Lab Results  Component Value Date   WBC 10.1 07/17/2023   HGB 13.1 07/17/2023   HCT 38.7 07/17/2023   MCV 82.7 07/17/2023   PLT 148 (L) 07/17/2023      Latest Ref Rng & Units 06/30/2022   10:05 AM  CMP  Glucose 70 - 99 mg/dL 75   BUN 6 - 20 mg/dL 11   Creatinine 3.08 - 1.00 mg/dL 6.57   Sodium 846 - 962 mmol/L 140   Potassium 3.5 - 5.2 mmol/L 4.9   Chloride 96 - 106 mmol/L 102   CO2 20 - 29 mmol/L 24   Calcium 8.7 - 10.2 mg/dL 9.4   Total Protein 6.0 - 8.5 g/dL 6.7   Total Bilirubin 0.0 - 1.2 mg/dL 0.6   Alkaline Phos 44 - 121 IU/L 88   AST 0 -  40 IU/L 32   ALT 0 - 32 IU/L 35    Edinburgh Score:    10/19/2021    9:09 AM  Edinburgh Postnatal Depression Scale Screening Tool  I have been able to laugh and see the funny side of things. 0  I have looked forward with enjoyment to things. 0  I have blamed myself unnecessarily when things went wrong. 1  I have been anxious or worried for no good reason. 0  I have felt scared or panicky for no good reason. 0  Things have been getting on top of me. 0  I have been so unhappy that I have  had difficulty sleeping. 0  I have felt sad or miserable. 0  I have been so unhappy that I have been crying. 0  The thought of harming myself has occurred to me. 0  Edinburgh Postnatal Depression Scale Total 1    Risk assessment for postpartum VTE and prophylactic treatment: Very high risk factors: If any risk factors: 6 weeks LMHW and None High risk factors: If 1 risk factor, mechanical prophylaxis and early ambulation , If > 1 risk factor OR 1 risk factor + 1 moderate risk factor: 3-6 weeks of LMWH, and None Moderate risk factors: If 3 or more risk factors: mechanical prophylaxis and early ambulation OR 3-6 weeks of LMWH and Cesarean delivery   Postpartum VTE prophylaxis with LMWH not indicated  After visit meds:  Allergies as of 07/18/2023   No Known Allergies   Med Rec must be completed prior to using this [facility]***      Discharge home in stable condition Infant Feeding: {Baby feeding:23562} Infant Disposition:{CHL IP OB HOME WITH ZOXWRU:04540} Discharge instruction: per After Visit Summary and Postpartum booklet. Activity: Advance as tolerated. Pelvic rest for 6 weeks.  Diet: routine diet Anticipated Birth Control:  Contraceptives: Tubal Ligation Postpartum Appointment:6 weeks Additional Postpartum F/U: Incision check 2 weeks Future Appointments: Future Appointments  Date Time Provider Department Center  11/15/2023  9:00 AM McDonough, Salomon Fick, PA-C [facility] None  12/31/2023  8:45 AM Elie Goody, MD [facility] None   Follow up Visit:  Follow-up Information     Conard Novak, MD. Go on 07/31/2023.   Specialty: Obstetrics and Gynecology Why: For incision check, (Keep previously scheduled appointment) Contact information: [address] [place] [address] [phone]                 Plan:  Kim Shannon was discharged to home in good condition. Follow-up appointment as directed.    Signed: *** Hit refresh and delete this  line

## 2023-07-18 NOTE — Interval H&P Note (Signed)
 History and Physical Interval Note:  07/18/2023 10:26 AM  Kim Shannon  has presented today for surgery, with the diagnosis of prior cesarean.  The various methods of treatment have been discussed with the patient and family. After consideration of risks, benefits and other options for treatment, the patient has consented to  Procedure(s): CESAREAN SECTION, WITH BILATERAL TUBAL LIGATION (N/A) as a surgical intervention.  The patient's history has been reviewed, patient examined, no change in status, stable for surgery.  I have reviewed the patient's chart and labs.  Questions were answered to the patient's satisfaction.    28 y.o. G2P1001  with undesired fertility, desires permanent sterilization.  Other reversible forms of contraception were discussed with patient; she declines all other modalities. Permanent nature of as well as associated risks of the procedure discussed with patient including but not limited to: risk of regret, permanence of method, bleeding, infection, injury to surrounding organs and need for additional procedures.  Failure risk of 0.5-1% with increased risk of ectopic gestation if pregnancy occurs was also discussed with patient.     Thomasene Mohair, MD, [facility] 07/18/2023 10:27 AM

## 2023-07-18 NOTE — Transfer of Care (Signed)
 Immediate Anesthesia Transfer of Care Note  Patient: Kim Shannon  Procedure(s) Performed: CESAREAN SECTION, WITH BILATERAL TUBAL LIGATION  Patient Location: Mother/Baby  Anesthesia Type:Spinal  Level of Consciousness: awake, alert , oriented, and patient cooperative  Airway & Oxygen Therapy: Patient Spontanous Breathing  Post-op Assessment: Report given to RN and Post -op Vital signs reviewed and stable  Post vital signs: Reviewed and stable  Last Vitals:  Vitals Value Taken Time  BP    Temp    Pulse    Resp    SpO2      Last Pain:  Vitals:   07/18/23 0823  TempSrc: Oral  PainSc: 0-No pain         Complications: No notable events documented.

## 2023-07-19 ENCOUNTER — Encounter: Payer: Self-pay | Admitting: Obstetrics and Gynecology

## 2023-07-19 LAB — CBC
HCT: 34.7 % — ABNORMAL LOW (ref 36.0–46.0)
Hemoglobin: 11.8 g/dL — ABNORMAL LOW (ref 12.0–15.0)
MCH: 28.4 pg (ref 26.0–34.0)
MCHC: 34 g/dL (ref 30.0–36.0)
MCV: 83.4 fL (ref 80.0–100.0)
Platelets: 104 10*3/uL — ABNORMAL LOW (ref 150–400)
RBC: 4.16 MIL/uL (ref 3.87–5.11)
RDW: 13.4 % (ref 11.5–15.5)
WBC: 16.6 10*3/uL — ABNORMAL HIGH (ref 4.0–10.5)
nRBC: 0 % (ref 0.0–0.2)

## 2023-07-19 LAB — SURGICAL PATHOLOGY

## 2023-07-19 MED ORDER — IBUPROFEN 600 MG PO TABS: 600.0000 mg | ORAL_TABLET | Freq: Four times a day (QID) | ORAL | 0 refills | Status: AC | PRN

## 2023-07-19 MED ORDER — COCONUT OIL OIL: 1.0000 | TOPICAL_OIL | Status: DC | PRN

## 2023-07-19 MED ORDER — ACETAMINOPHEN 325 MG PO TABS: 650.0000 mg | ORAL_TABLET | ORAL | Status: DC | PRN

## 2023-07-19 MED ORDER — DIBUCAINE (PERIANAL) 1 % EX OINT: 1.0000 | TOPICAL_OINTMENT | CUTANEOUS | Status: DC | PRN

## 2023-07-19 MED ORDER — SENNOSIDES-DOCUSATE SODIUM 8.6-50 MG PO TABS
2.0000 | ORAL_TABLET | ORAL | Status: DC
Start: 2023-07-19 — End: 2023-12-31

## 2023-07-19 MED ORDER — WITCH HAZEL-GLYCERIN EX PADS: 1.0000 | MEDICATED_PAD | CUTANEOUS | Status: DC | PRN

## 2023-07-19 NOTE — Anesthesia Postprocedure Evaluation (Signed)
 Anesthesia Post Note  Patient: Kim Shannon  Procedure(s) Performed: CESAREAN SECTION, WITH BILATERAL TUBAL LIGATION  Patient location during evaluation: Mother Baby Anesthesia Type: Spinal Level of consciousness: awake and alert and oriented Pain management: pain level controlled Vital Signs Assessment: post-procedure vital signs reviewed and stable Respiratory status: respiratory function stable Cardiovascular status: stable Postop Assessment: no headache, no backache, patient able to bend at knees, no apparent nausea or vomiting, able to ambulate and adequate PO intake Anesthetic complications: no   No notable events documented.   Last Vitals:  Vitals:   07/19/23 0749 07/19/23 1141  BP: 111/68 112/70  Pulse: 72 76  Resp: 18 19  Temp: 36.5 C 36.7 C  SpO2: 99% 100%    Last Pain:  Vitals:   07/19/23 1141  TempSrc: Oral  PainSc:                  [name]

## 2023-07-19 NOTE — Progress Notes (Signed)
 PostOp/Postpartum Day #1  Subjective: 28 y.o. [id number]  postop/postpartum day #1 status post normal spontaneous vaginal delivery and repeat cesarean section. She is ambulating, is tolerating po, is voiding spontaneously.  Her pain is well controlled on PO pain medications. Her lochia is less than menses.  Objective: BP 111/68 (BP Location: Right Arm)   Pulse 72   Temp 97.7 F (36.5 C) (Oral)   Resp 18   Ht 5\' 6"  (1.676 m)   Wt 84.8 kg   SpO2 99%   Breastfeeding Unknown   BMI 30.18 kg/m    Physical Exam:  General: alert, cooperative, appears stated age, and no distress Breasts: soft/nontender Pulm: nl effort Abdomen: soft, non-tender, active bowel sounds Uterine Fundus: firm Incision: healing well, no significant drainage, honeycomb dressing clean, dry, and intact. Moderate amount of drainage at On-Q dressing.  Perineum: minimal edema, intact Lochia: appropriate DVT Evaluation: No evidence of DVT seen on physical exam.  Recent Labs    07/17/23 0819 07/19/23 0458  HGB 13.1 11.8*  HCT 38.7 34.7*  WBC 10.1 16.6*  PLT 148* 104*    Assessment/Plan: 28 y.o. G2P2002 postop/postpartum day # 1  1. Continue routine postpartum care  2. Infant feeding status: breast feeding -Lactation consult PRN for breastfeeding   3. Contraception plan:  LAM/condoms   4. Acute blood loss anemia - clinically not significant .  -Hemodynamically stable and asymptomatic -Intervention: no intervention   5. Immunization status:   all immunizations up to date  6. Encourage ambulation in room/hallways   Disposition:  undecided on whether she would like to be discharged today or tomorrow   LOS: 1 day   Zyheir Daft, CNM 07/19/2023, 9:28 AM   ----- Roney Jaffe Certified Nurse Midwife [facility]

## 2023-07-19 NOTE — Progress Notes (Signed)
 Patient discharged home with family. Discharge instructions, when to follow up, and prescriptions reviewed with patient. Patient verbalized understanding. Patient will be escorted out by auxiliary.

## 2023-07-19 NOTE — Lactation Note (Signed)
 This note was copied from a baby's chart. Lactation Consultation Note  Patient Name: Kim Shannon FAOZH'Y Date: 07/19/2023 Age:30 hours Reason for consult: Follow-up assessment;Term;RN request   Maternal Data This is mom's 2nd baby, repeat C/S. Mom with history of anemia and breast biopsy.  On follow-up today mom reports baby has been latching and breastfeeding well, and has wet and 8 stool diapers.  Has patient been taught Hand Expression?: Yes How long did the patient breastfeed?: 13 months  Feeding Mother's Current Feeding Choice: Breast Milk    Interventions Interventions: Breast feeding basics reviewed;Education   Discharge Discharge Education: Engorgement and breast care;Warning signs for feeding baby;Outpatient recommendation Pump: Personal  Consult Status Consult Status: Complete Date: 07/19/23 Follow-up type: In-patient  Update provided to care nurse.  Fuller Song 07/19/2023, 12:51 PM

## 2023-08-28 DIAGNOSIS — Z1332 Encounter for screening for maternal depression: Secondary | ICD-10-CM | POA: Diagnosis not present

## 2023-08-28 NOTE — Progress Notes (Signed)
 Postpartum Visit   Chief Complaint  Patient presents with  . 6 week postpartum     C-Section 07/18/23    History of Present Illness: Patient is a 28 y.o. [id number] presents for postpartum visit.  Date of delivery: 07/18/2023 Type of delivery:  C-section, repeat Episiotomy No.  Laceration: not applicable  Pregnancy or labor problems:  no Any problems since the delivery:  no Bleeding: none currently. Breast concerns: none Baby: doing well No incision problems  Newborn Details:  SINGLETON :  1. Baby's name: [name]. Birth weight: 3,580 grams Maternal Details:  Breast Feeding:  yes Post partum depression/anxiety noted:  no Edinburgh Post-Partum Depression Score:  0  Date of last PAP: 02/03/2021  NILM, HPV negative   Past Medical History:  Diagnosis Date  . Acne vulgaris   . Anemia   . Bilateral plantar fasciitis 2018  . Breast mass, left 01/24/2019   biopsy= benign  . Dysplastic nevus 03/24/2015   left side above waistline, mild atypia, lateral margin involved.  . Family history of breast cancer    PGM  . Folliculitis 2021  . History of irregular menstrual bleeding 10/16/2017  . Vitamin D  deficiency     Past Surgical History:  Procedure Laterality Date  . BREAST BIOPSY Left 02/19/2019   benign  . CESAREAN SECTION  10/18/2021   Dr. Garnette Mace  . CESAREAN SECTION  07/18/2023   CESAREAN SECTION, WITH BILATERAL TUBAL LIGATION [facility], Dr. Garnette Mace, MD  . EXTRACTION TEETH     wisdom teeth    Prior to Admission medications   Medication Sig Taking? Last Dose  prenatal vitamin with iron-folic acid  (PRENATAL TABLETS) tablet Take 1 tablet by mouth once daily Yes Taking    No Known Allergies   Social History   Socioeconomic History  . Marital status: Married  Tobacco Use  . Smoking status: Never  . Smokeless tobacco: Never  Substance and Sexual Activity  . Alcohol use: Not Currently    Alcohol/week: 3.0 standard drinks of alcohol    Types: 3 Glasses of  wine per week  . Drug use: Never  . Sexual activity: Yes    Partners: Male    Birth control/protection: None  Other Topics Concern  . Would you please tell us  about the people who live in your home, your pets, or anything else important to your social life? No   Social Drivers of Health   Food Insecurity: No Food Insecurity (07/18/2023)   Received from [facility]   Hunger Vital Sign   . Worried About Programme researcher, broadcasting/film/video in the Last Year: Never true   . Ran Out of Food in the Last Year: Never true  Transportation Needs: No Transportation Needs (07/18/2023)   [facility] - Transportation   . Lack of Transportation (Medical): No   . Lack of Transportation (Non-Medical): No  Housing Stability: Low Risk  (07/18/2023)   Received from [facility] Stability Vital Sign   . Unable to Pay for Housing in the Last Year: No   . Number of Times Moved in the Last Year: 0   . Homeless in the Last Year: No    Family History  Problem Relation Name Age of Onset  . Breast cancer Paternal Grandmother Inocente Arenas   . Diabetes Paternal Grandfather Elsie Arenas   . High blood pressure (Hypertension) Paternal Grandfather Elsie Arenas   . Prostate cancer Paternal Grandfather Elsie Arenas  Review of Systems  Constitutional: Negative.   HENT: Negative.    Eyes: Negative.   Respiratory: Negative.    Cardiovascular: Negative.   Gastrointestinal: Negative.   Genitourinary: Negative.   Musculoskeletal: Negative.   Skin: Negative.   Neurological: Negative.   Endo/Heme/Allergies: Negative.   Psychiatric/Behavioral: Negative.       Physical Exam BP 91/61   Pulse 62   Ht 167.6 cm (5' 6)   Wt 74.4 kg (164 lb)   LMP  (LMP Unknown)   Breastfeeding Yes   BMI 26.47 kg/m   Physical Exam Constitutional:      Appearance: Normal appearance.  HENT:     Head: Normocephalic and atraumatic.  Eyes:     General: No scleral icterus.    Conjunctiva/sclera:  Conjunctivae normal.  Pulmonary:     Effort: Pulmonary effort is normal. No respiratory distress.  Abdominal:     General: There is no distension.     Palpations: Abdomen is soft. There is no mass.     Tenderness: There is no abdominal tenderness. There is no guarding or rebound.     Hernia: No hernia is present.     Comments: Incision: without erythema, induration, warmth, and tenderness. It is clean, dry, and intact.    Musculoskeletal:        General: No swelling. Normal range of motion.  Neurological:     General: No focal deficit present.     Mental Status: She is alert and oriented to person, place, and time.     Cranial Nerves: No cranial nerve deficit.  Skin:    General: Skin is warm and dry.     Findings: No lesion.  Psychiatric:        Mood and Affect: Mood normal.        Behavior: Behavior normal.        Judgment: Judgment normal.    Assessment: 28 y.o. H7E7997 presenting for 6 week postpartum visit  Plan: Problem List Items Addressed This Visit   None Visit Diagnoses     Postpartum exam (HHS-HCC)    -  Primary      1) Contraception: s/p BTL  2)  Pap - ASCCP guidelines and rational discussed.  Patient opts for routine screening interval. Up To date  3) Patient underwent screening for postpartum depression with no concerns noted.  Return in 1 year (on 08/27/2024) for [place], Annual Gyn Exam.   Garnette Mace, MD 08/28/2023 8:42 AM

## 2023-11-15 ENCOUNTER — Ambulatory Visit (INDEPENDENT_AMBULATORY_CARE_PROVIDER_SITE_OTHER): Payer: BC Managed Care – PPO | Admitting: Physician Assistant

## 2023-11-15 ENCOUNTER — Other Ambulatory Visit: Payer: Self-pay | Admitting: Physician Assistant

## 2023-11-15 ENCOUNTER — Encounter: Payer: Self-pay | Admitting: Physician Assistant

## 2023-11-15 VITALS — BP 120/70 | HR 60 | Temp 97.8°F | Resp 16 | Ht 66.0 in | Wt 164.0 lb

## 2023-11-15 DIAGNOSIS — Z0001 Encounter for general adult medical examination with abnormal findings: Secondary | ICD-10-CM

## 2023-11-15 DIAGNOSIS — E782 Mixed hyperlipidemia: Secondary | ICD-10-CM | POA: Diagnosis not present

## 2023-11-15 DIAGNOSIS — Z01419 Encounter for gynecological examination (general) (routine) without abnormal findings: Secondary | ICD-10-CM | POA: Diagnosis not present

## 2023-11-15 DIAGNOSIS — Z124 Encounter for screening for malignant neoplasm of cervix: Secondary | ICD-10-CM

## 2023-11-15 DIAGNOSIS — Z1329 Encounter for screening for other suspected endocrine disorder: Secondary | ICD-10-CM | POA: Diagnosis not present

## 2023-11-15 DIAGNOSIS — R5383 Other fatigue: Secondary | ICD-10-CM | POA: Diagnosis not present

## 2023-11-15 NOTE — Progress Notes (Signed)
 [facility] [address] [place], [place] [address]  Internal MEDICINE  Office Visit Note  Patient Name: Kim Shannon  [id number]  [id number]  Date of Service: 11/15/2023  Chief Complaint  Patient presents with   Annual Exam   Anemia     HPI Pt is here for routine health maintenance examination -c-section in April, doing well postpartum and had her visit with OBGYN already. Breastfeeding currently and reports this is going well. Her son, Kim Shannon, is in office with her today -pap done today, no cycles currently -currently only taking prenatal vitamin, but may start probiotic soon due to some abdominal bloating -lab slip given  Current Medication: Outpatient Encounter Medications as of 11/15/2023  Medication Sig   acetaminophen  (TYLENOL ) 325 MG tablet Take 2 tablets (650 mg total) by mouth every 4 (four) hours as needed.   coconut oil OIL Apply 1 Application topically as needed.   cyanocobalamin  (VITAMIN B12) 500 MCG tablet Take 500 mcg by mouth daily.   dibucaine (NUPERCAINAL) 1 % OINT Place 1 Application rectally as needed for hemorrhoids.   Prenatal Vit-Fe Fumarate-FA (MULTIVITAMIN-PRENATAL) 27-0.8 MG TABS tablet Take 1 tablet by mouth daily at 12 noon.   senna-docusate (SENOKOT-S) 8.6-50 MG tablet Take 2 tablets by mouth daily.   witch hazel-glycerin  (TUCKS) pad Apply 1 Application topically as needed for hemorrhoids.   No facility-administered encounter medications on file as of 11/15/2023.    Surgical History: Past Surgical History:  Procedure Laterality Date   BREAST BIOPSY Left    CESAREAN SECTION  10/18/2021   Procedure: CESAREAN SECTION;  Surgeon: Leonce Garnette BIRCH, MD;  Location: [facility] ORS;  Service: Obstetrics;;   CESAREAN SECTION WITH BILATERAL TUBAL LIGATION N/A 07/18/2023   Procedure: CESAREAN SECTION, WITH BILATERAL TUBAL LIGATION;  Surgeon: Leonce Garnette BIRCH, MD;  Location: [facility] ORS;  Service: Obstetrics;  Laterality: N/A;   WISDOM TOOTH  EXTRACTION      Medical History: Past Medical History:  Diagnosis Date   Anemia    Dysplastic nevus 03/24/2015   left side above waistline, mild atypia, lateral margin involved.   PONV (postoperative nausea and vomiting)     Family History: Family History  Problem Relation Age of Onset   Breast cancer Paternal Grandmother    Prostate cancer Paternal Grandfather    Diabetes Paternal Grandfather       Review of Systems  Constitutional:  Negative for chills, fatigue and unexpected weight change.  HENT:  Negative for congestion, postnasal drip, rhinorrhea, sneezing and sore throat.   Eyes:  Negative for redness.  Respiratory:  Negative for cough, chest tightness and shortness of breath.   Cardiovascular:  Negative for chest pain and palpitations.  Gastrointestinal:  Negative for abdominal pain, constipation, diarrhea, nausea and vomiting.  Genitourinary:  Negative for dysuria and frequency.  Musculoskeletal:  Negative for arthralgias, back pain, joint swelling and neck pain.  Skin:  Negative for rash.  Neurological: Negative.  Negative for tremors and numbness.  Hematological:  Negative for adenopathy. Does not bruise/bleed easily.  Psychiatric/Behavioral:  Negative for behavioral problems (Depression), sleep disturbance and suicidal ideas. The patient is not nervous/anxious.      Vital Signs: BP 120/70   Pulse 60   Temp 97.8 F (36.6 C)   Resp 16   Ht 5' 6 (1.676 m)   Wt 164 lb (74.4 kg)   SpO2 95%   BMI 26.47 kg/m    Physical Exam Exam conducted with a chaperone present.  Constitutional:  General: She is not in acute distress.    Appearance: She is well-developed. She is not diaphoretic.  HENT:     Head: Normocephalic and atraumatic.     Mouth/Throat:     Pharynx: No oropharyngeal exudate.  Eyes:     Pupils: Pupils are equal, round, and reactive to light.  Neck:     Thyroid : No thyromegaly.     Vascular: No JVD.     Trachea: No tracheal deviation.   Cardiovascular:     Rate and Rhythm: Normal rate and regular rhythm.     Heart sounds: Normal heart sounds. No murmur heard.    No friction rub. No gallop.  Pulmonary:     Effort: Pulmonary effort is normal. No respiratory distress.     Breath sounds: No wheezing or rales.  Chest:     Chest wall: No tenderness.  Breasts:    Right: Normal. No mass.     Left: Normal. No mass.  Abdominal:     General: Bowel sounds are normal.     Palpations: Abdomen is soft.     Tenderness: There is no abdominal tenderness.     Comments: Well healed c-section scar  Genitourinary:    Exam position: Lithotomy position.     Vagina: No vaginal discharge.     Cervix: Normal.     Comments: Pap performed Musculoskeletal:        General: Normal range of motion.     Cervical back: Normal range of motion and neck supple.  Lymphadenopathy:     Cervical: No cervical adenopathy.  Skin:    General: Skin is warm and dry.  Neurological:     Mental Status: She is alert and oriented to person, place, and time.     Cranial Nerves: No cranial nerve deficit.  Psychiatric:        Behavior: Behavior normal.        Thought Content: Thought content normal.        Judgment: Judgment normal.      LABS: No results found for this or any previous visit (from the past 2160 hours).      Assessment/Plan: 1. Encounter for general adult medical examination with abnormal findings (Primary) CPE performed, lab slip given  2. Visit for gynecologic examination Breast and pelvic exam performed  3. Routine cervical smear - IGP, Aptima HPV   General Counseling: Timothea Bodenheimer verbalizes understanding of the findings of todays visit and agrees with plan of treatment. I have discussed any further diagnostic evaluation that may be needed or ordered today. We also reviewed her medications today. she has been encouraged to call the office with any questions or concerns that should arise related to todays  visit.    Counseling:    No orders of the defined types were placed in this encounter.   No orders of the defined types were placed in this encounter.   This patient was seen by Tinnie Pro, PA-C in collaboration with Dr. Sigrid Bathe as a part of collaborative care agreement.  Total time spent:35 Minutes  Time spent includes review of chart, medications, test results, and follow up plan with the patient.     Sigrid CHRISTELLA Bathe, MD  Internal Medicine

## 2023-11-16 LAB — IRON AND TIBC
Iron Saturation: 37 % (ref 15–55)
Iron: 106 ug/dL (ref 27–159)
Total Iron Binding Capacity: 290 ug/dL (ref 250–450)
UIBC: 184 ug/dL (ref 131–425)

## 2023-11-16 LAB — COMPREHENSIVE METABOLIC PANEL WITH GFR
ALT: 17 IU/L (ref 0–32)
AST: 24 IU/L (ref 0–40)
Albumin: 4.7 g/dL (ref 4.0–5.0)
Alkaline Phosphatase: 63 IU/L (ref 44–121)
BUN/Creatinine Ratio: 15 (ref 9–23)
BUN: 14 mg/dL (ref 6–20)
Bilirubin Total: 0.5 mg/dL (ref 0.0–1.2)
CO2: 22 mmol/L (ref 20–29)
Calcium: 9.3 mg/dL (ref 8.7–10.2)
Chloride: 104 mmol/L (ref 96–106)
Creatinine, Ser: 0.91 mg/dL (ref 0.57–1.00)
Globulin, Total: 2.2 g/dL (ref 1.5–4.5)
Glucose: 81 mg/dL (ref 70–99)
Potassium: 4.6 mmol/L (ref 3.5–5.2)
Sodium: 141 mmol/L (ref 134–144)
Total Protein: 6.9 g/dL (ref 6.0–8.5)
eGFR: 87 mL/min/1.73 (ref >59)

## 2023-11-16 LAB — CBC WITH DIFFERENTIAL/PLATELET
Basophils Absolute: 0.1 x10E3/uL (ref 0.0–0.2)
Basos: 1 %
EOS (ABSOLUTE): 0.1 x10E3/uL (ref 0.0–0.4)
Eos: 2 %
Hematocrit: 40.5 % (ref 34.0–46.6)
Hemoglobin: 13.1 g/dL (ref 11.1–15.9)
Immature Grans (Abs): 0 x10E3/uL (ref 0.0–0.1)
Immature Granulocytes: 0 %
Lymphocytes Absolute: 3.2 x10E3/uL — ABNORMAL HIGH (ref 0.7–3.1)
Lymphs: 43 %
MCH: 28.4 pg (ref 26.6–33.0)
MCHC: 32.3 g/dL (ref 31.5–35.7)
MCV: 88 fL (ref 79–97)
Monocytes Absolute: 0.6 x10E3/uL (ref 0.1–0.9)
Monocytes: 8 %
Neutrophils Absolute: 3.4 x10E3/uL (ref 1.4–7.0)
Neutrophils: 46 %
Platelets: 205 x10E3/uL (ref 150–450)
RBC: 4.61 x10E6/uL (ref 3.77–5.28)
RDW: 12.9 % (ref 11.7–15.4)
WBC: 7.4 x10E3/uL (ref 3.4–10.8)

## 2023-11-16 LAB — LIPID PANEL WITH LDL/HDL RATIO
Cholesterol, Total: 209 mg/dL — ABNORMAL HIGH (ref 100–199)
HDL: 86 mg/dL (ref >39)
LDL Chol Calc (NIH): 118 mg/dL — ABNORMAL HIGH (ref 0–99)
LDL/HDL Ratio: 1.4 ratio (ref 0.0–3.2)
Triglycerides: 27 mg/dL (ref 0–149)
VLDL Cholesterol Cal: 5 mg/dL (ref 5–40)

## 2023-11-16 LAB — FERRITIN: Ferritin: 107 ng/mL (ref 15–150)

## 2023-11-16 LAB — T4, FREE: Free T4: 1.16 ng/dL (ref 0.82–1.77)

## 2023-11-16 LAB — TSH: TSH: 1.46 u[IU]/mL (ref 0.450–4.500)

## 2023-11-19 LAB — IGP, APTIMA HPV: HPV Aptima: NEGATIVE

## 2023-11-20 ENCOUNTER — Ambulatory Visit: Payer: Self-pay | Admitting: Physician Assistant

## 2023-11-20 NOTE — Telephone Encounter (Signed)
-----   Message from Tinnie MARLA Pro sent at 11/20/2023  2:58 PM EDT ----- Please let her know that her lymphocyte count is borderline elevated, but overall WBC normal. Also cholesterol is elevated but this can fluctuate with breastfeeding and be mindful with diet to and  exercise. Otherwise labs looked good and pap was normal with negative HPV. Due for repeat in 5 years now. ----- Message ----- From: Interface, [facility] Lab Results In Sent: 11/16/2023   5:36 AM EDT To: Tinnie MARLA Pro, PA-C

## 2023-11-20 NOTE — Telephone Encounter (Signed)
 Spoke with patient regarding lab results.

## 2023-12-31 ENCOUNTER — Encounter: Payer: Self-pay | Admitting: Dermatology

## 2023-12-31 ENCOUNTER — Ambulatory Visit (INDEPENDENT_AMBULATORY_CARE_PROVIDER_SITE_OTHER): Payer: BC Managed Care – PPO | Admitting: Dermatology

## 2023-12-31 DIAGNOSIS — D229 Melanocytic nevi, unspecified: Secondary | ICD-10-CM

## 2023-12-31 DIAGNOSIS — Z1283 Encounter for screening for malignant neoplasm of skin: Secondary | ICD-10-CM | POA: Diagnosis not present

## 2023-12-31 DIAGNOSIS — L814 Other melanin hyperpigmentation: Secondary | ICD-10-CM | POA: Diagnosis not present

## 2023-12-31 DIAGNOSIS — D225 Melanocytic nevi of trunk: Secondary | ICD-10-CM | POA: Diagnosis not present

## 2023-12-31 DIAGNOSIS — L578 Other skin changes due to chronic exposure to nonionizing radiation: Secondary | ICD-10-CM

## 2023-12-31 DIAGNOSIS — W908XXA Exposure to other nonionizing radiation, initial encounter: Secondary | ICD-10-CM

## 2023-12-31 DIAGNOSIS — D1801 Hemangioma of skin and subcutaneous tissue: Secondary | ICD-10-CM

## 2023-12-31 DIAGNOSIS — Z86018 Personal history of other benign neoplasm: Secondary | ICD-10-CM

## 2023-12-31 NOTE — Patient Instructions (Signed)

## 2023-12-31 NOTE — Progress Notes (Signed)
   Follow-Up Visit   Subjective  Kim Shannon is a 28 y.o. female who presents for the following: Skin Cancer Screening and Full Body Skin Exam  The patient presents for Total-Body Skin Exam (TBSE) for skin cancer screening and mole check. The patient has spots, moles and lesions to be evaluated, some may be new or changing and the patient may have concern these could be cancer.  Hx DN.   The following portions of the chart were reviewed this encounter and updated as appropriate: medications, allergies, medical history  Review of Systems:  No other skin or systemic complaints except as noted in HPI or Assessment and Plan.  Objective  Well appearing patient in no apparent distress; mood and affect are within normal limits.  A full examination was performed including scalp, head, eyes, ears, nose, lips, neck, chest, axillae, abdomen, back, buttocks, bilateral upper extremities, bilateral lower extremities, hands, feet, fingers, toes, fingernails, and toenails. All findings within normal limits unless otherwise noted below.   Exam of nails limited by presence of nail polish.  Relevant physical exam findings are noted in the Assessment and Plan.    Assessment & Plan   SKIN CANCER SCREENING PERFORMED TODAY.  ACTINIC DAMAGE - Chronic condition, secondary to cumulative UV/sun exposure - diffuse scaly erythematous macules with underlying dyspigmentation - Recommend daily broad spectrum sunscreen SPF 30+ to sun-exposed areas, reapply every 2 hours as needed.  - Staying in the shade or wearing long sleeves, sun glasses (UVA+UVB protection) and wide brim hats (4-inch brim around the entire circumference of the hat) are also recommended for sun protection.  - Call for new or changing lesions.  LENTIGINES HEMANGIOMAS - Benign normal skin lesions - Benign-appearing - Call for any changes  MELANOCYTIC NEVI - Tan-brown and/or pink-flesh-colored symmetric macules and papules - Benign  appearing on exam today - Observation - Call clinic for new or changing moles - Recommend daily use of broad spectrum spf 30+ sunscreen to sun-exposed areas.  - 3 x 2 mm left UQA  History of Dysplastic Nevi - No evidence of recurrence today at left side above waistline, mild atypia 2016  - Recommend regular full body skin exams - Recommend daily broad spectrum sunscreen SPF 30+ to sun-exposed areas, reapply every 2 hours as needed.  - Call if any new or changing lesions are noted between office visits     MULTIPLE BENIGN NEVI   LENTIGINES   ACTINIC ELASTOSIS   CHERRY ANGIOMA   Return in about 1 year (around 12/30/2024) for TBSE, with Dr. Claudene, HxDN.  LILLETTE Lonell Drones, RMA, am acting as scribe for Boneta Claudene, MD .   Documentation: I have reviewed the above documentation for accuracy and completeness, and I agree with the above.  Boneta Claudene, MD

## 2024-11-17 ENCOUNTER — Encounter: Admitting: Physician Assistant

## 2024-12-30 ENCOUNTER — Ambulatory Visit: Admitting: Dermatology
